# Patient Record
Sex: Male | Born: 1965 | Race: Black or African American | Hispanic: No | Marital: Single | Smoking: Never smoker
Health system: Southern US, Community
[De-identification: ages and names within clinical notes are randomized; demographics above are authoritative.]

## PROBLEM LIST (undated history)

## (undated) DIAGNOSIS — M109 Gout, unspecified: Secondary | ICD-10-CM

## (undated) DIAGNOSIS — F329 Major depressive disorder, single episode, unspecified: Secondary | ICD-10-CM

## (undated) DIAGNOSIS — I10 Essential (primary) hypertension: Secondary | ICD-10-CM

## (undated) DIAGNOSIS — F32A Depression, unspecified: Secondary | ICD-10-CM

## (undated) DIAGNOSIS — I509 Heart failure, unspecified: Secondary | ICD-10-CM

## (undated) HISTORY — PX: APPENDECTOMY: SHX54

---

## 2007-08-30 ENCOUNTER — Emergency Department (HOSPITAL_COMMUNITY): Admission: EM | Admit: 2007-08-30 | Discharge: 2007-08-30 | Payer: Self-pay | Admitting: Emergency Medicine

## 2008-01-05 ENCOUNTER — Ambulatory Visit: Payer: Self-pay | Admitting: Internal Medicine

## 2008-01-05 ENCOUNTER — Inpatient Hospital Stay (HOSPITAL_COMMUNITY): Admission: EM | Admit: 2008-01-05 | Discharge: 2008-01-06 | Payer: Self-pay | Admitting: Emergency Medicine

## 2008-01-05 ENCOUNTER — Ambulatory Visit: Payer: Self-pay | Admitting: Cardiology

## 2008-01-06 ENCOUNTER — Encounter (INDEPENDENT_AMBULATORY_CARE_PROVIDER_SITE_OTHER): Payer: Self-pay | Admitting: Internal Medicine

## 2008-04-26 ENCOUNTER — Emergency Department (HOSPITAL_COMMUNITY): Admission: EM | Admit: 2008-04-26 | Discharge: 2008-04-27 | Payer: Self-pay | Admitting: Emergency Medicine

## 2008-05-07 ENCOUNTER — Emergency Department (HOSPITAL_COMMUNITY): Admission: EM | Admit: 2008-05-07 | Discharge: 2008-05-08 | Payer: Self-pay | Admitting: Family Medicine

## 2008-07-02 ENCOUNTER — Inpatient Hospital Stay (HOSPITAL_COMMUNITY): Admission: EM | Admit: 2008-07-02 | Discharge: 2008-07-04 | Payer: Self-pay | Admitting: Emergency Medicine

## 2008-07-02 ENCOUNTER — Ambulatory Visit: Payer: Self-pay | Admitting: Cardiology

## 2008-07-07 ENCOUNTER — Ambulatory Visit: Payer: Self-pay | Admitting: Internal Medicine

## 2008-07-07 LAB — CONVERTED CEMR LAB
CO2: 32 meq/L (ref 19–32)
Calcium: 9 mg/dL (ref 8.4–10.5)
Creatinine, Ser: 1.8 mg/dL — ABNORMAL HIGH (ref 0.4–1.5)
GFR calc Af Amer: 53 mL/min
GFR calc non Af Amer: 44 mL/min
Glucose, Bld: 96 mg/dL (ref 70–99)
Sodium: 144 meq/L (ref 135–145)

## 2008-08-16 ENCOUNTER — Ambulatory Visit: Payer: Self-pay | Admitting: Internal Medicine

## 2008-08-19 ENCOUNTER — Inpatient Hospital Stay (HOSPITAL_COMMUNITY): Admission: EM | Admit: 2008-08-19 | Discharge: 2008-08-20 | Payer: Self-pay | Admitting: Emergency Medicine

## 2008-08-19 ENCOUNTER — Ambulatory Visit: Payer: Self-pay | Admitting: Cardiology

## 2008-10-22 ENCOUNTER — Ambulatory Visit: Payer: Self-pay | Admitting: Internal Medicine

## 2008-10-27 DIAGNOSIS — I110 Hypertensive heart disease with heart failure: Secondary | ICD-10-CM | POA: Insufficient documentation

## 2008-10-27 DIAGNOSIS — I509 Heart failure, unspecified: Secondary | ICD-10-CM | POA: Insufficient documentation

## 2008-10-27 DIAGNOSIS — E785 Hyperlipidemia, unspecified: Secondary | ICD-10-CM

## 2008-11-01 ENCOUNTER — Emergency Department (HOSPITAL_COMMUNITY): Admission: EM | Admit: 2008-11-01 | Discharge: 2008-11-01 | Payer: Self-pay | Admitting: Emergency Medicine

## 2008-11-01 ENCOUNTER — Ambulatory Visit: Payer: Self-pay | Admitting: Cardiology

## 2008-11-17 ENCOUNTER — Emergency Department (HOSPITAL_COMMUNITY): Admission: EM | Admit: 2008-11-17 | Discharge: 2008-11-17 | Payer: Self-pay | Admitting: Emergency Medicine

## 2008-11-18 ENCOUNTER — Ambulatory Visit: Payer: Self-pay | Admitting: Cardiology

## 2008-11-18 ENCOUNTER — Emergency Department (HOSPITAL_COMMUNITY): Admission: EM | Admit: 2008-11-18 | Discharge: 2008-11-18 | Payer: Self-pay | Admitting: Emergency Medicine

## 2008-11-19 ENCOUNTER — Inpatient Hospital Stay (HOSPITAL_COMMUNITY): Admission: EM | Admit: 2008-11-19 | Discharge: 2008-11-22 | Payer: Self-pay | Admitting: Emergency Medicine

## 2008-11-22 ENCOUNTER — Encounter (INDEPENDENT_AMBULATORY_CARE_PROVIDER_SITE_OTHER): Payer: Self-pay | Admitting: Internal Medicine

## 2008-12-16 ENCOUNTER — Emergency Department (HOSPITAL_COMMUNITY): Admission: EM | Admit: 2008-12-16 | Discharge: 2008-12-16 | Payer: Self-pay | Admitting: Emergency Medicine

## 2008-12-28 ENCOUNTER — Ambulatory Visit: Payer: Self-pay | Admitting: Family Medicine

## 2009-01-20 ENCOUNTER — Encounter: Payer: Self-pay | Admitting: Internal Medicine

## 2009-01-20 ENCOUNTER — Ambulatory Visit: Payer: Self-pay | Admitting: Internal Medicine

## 2009-02-23 ENCOUNTER — Emergency Department (HOSPITAL_COMMUNITY): Admission: EM | Admit: 2009-02-23 | Discharge: 2009-02-24 | Payer: Self-pay | Admitting: Emergency Medicine

## 2009-04-03 ENCOUNTER — Emergency Department (HOSPITAL_COMMUNITY): Admission: EM | Admit: 2009-04-03 | Discharge: 2009-04-03 | Payer: Self-pay | Admitting: Emergency Medicine

## 2009-06-29 ENCOUNTER — Encounter (INDEPENDENT_AMBULATORY_CARE_PROVIDER_SITE_OTHER): Payer: Self-pay | Admitting: *Deleted

## 2010-05-03 IMAGING — CT CT HEAD W/O CM
1 series · 16 of 30 positions shown, 20 images · non-contrast
Comparison: No priors

CLINICAL DATA: Hypertension/dizziness

CT HEAD WITHOUT CONTRAST
TECHNIQUE: Contiguous axial images were obtained from the base of
the skull through the vertex without contrast.

[Series 2: head routine 4.8 h37s · axial · 0.49mm/px · z∈[-118,+40]mm · 16 of 36 slices shown, 20 images]
[im 2/36  brain]
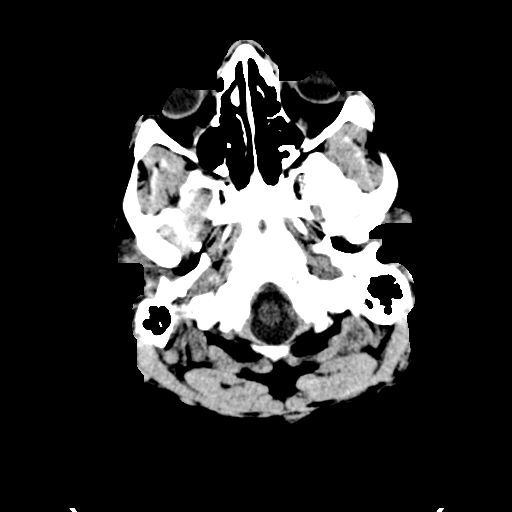
[im 2/36  bone]
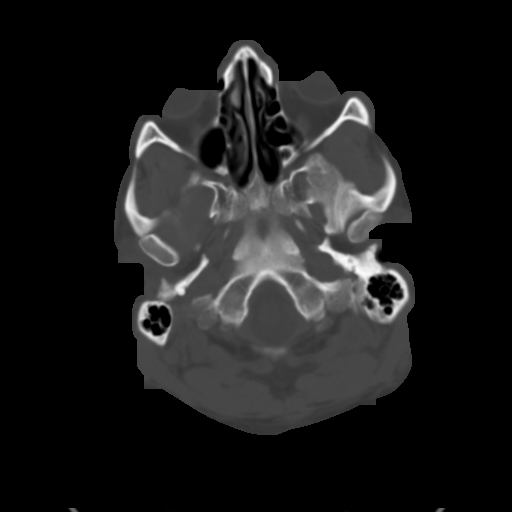
[im 4/36  brain]
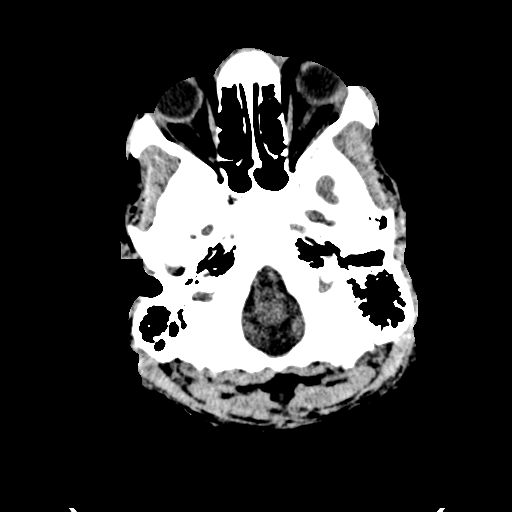
[im 7/36  brain]
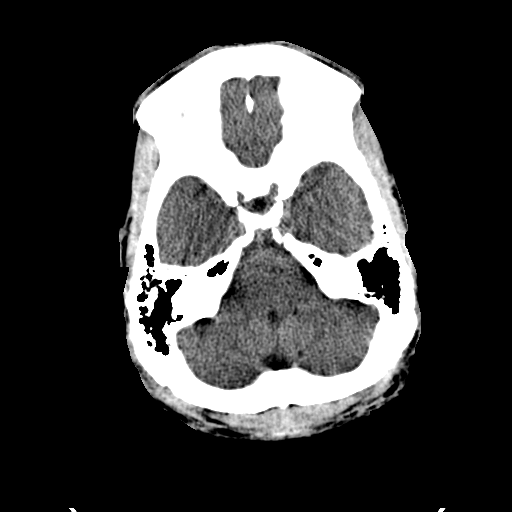
[im 9/36  brain]
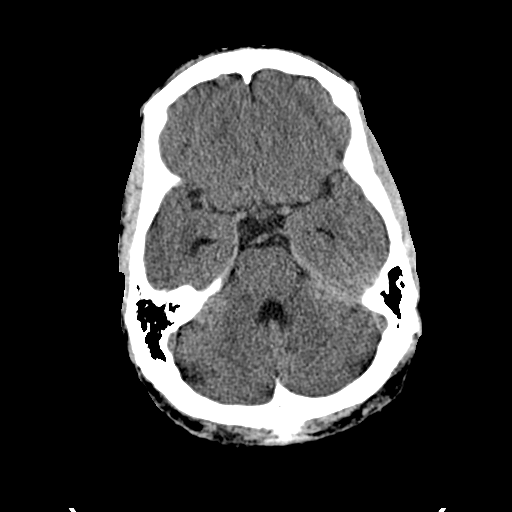
[im 10/36  brain]
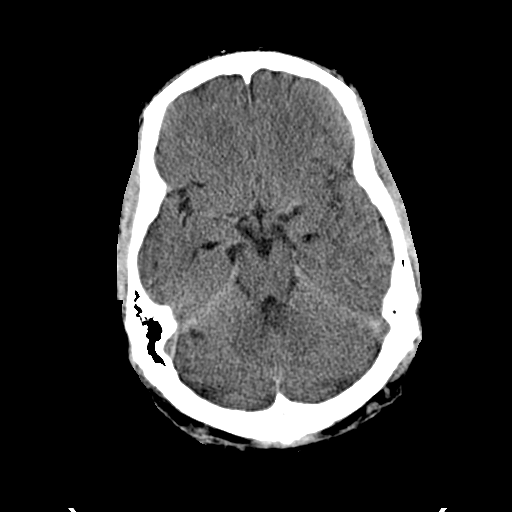
[im 10/36  bone]
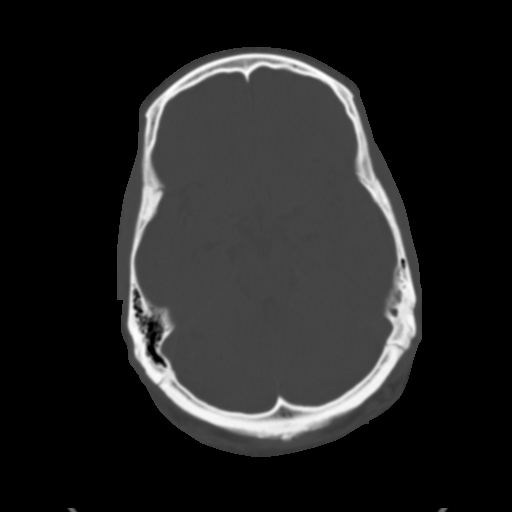
[im 13/36  brain]
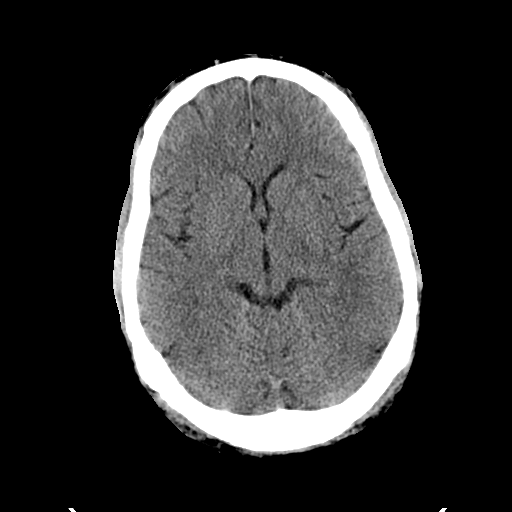
[im 15/36  brain]
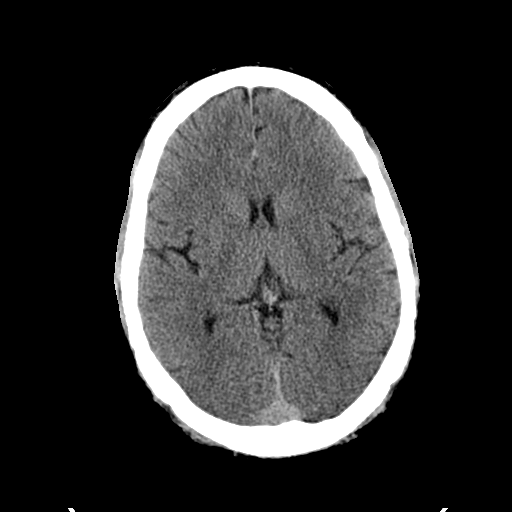
[im 17/36  brain]
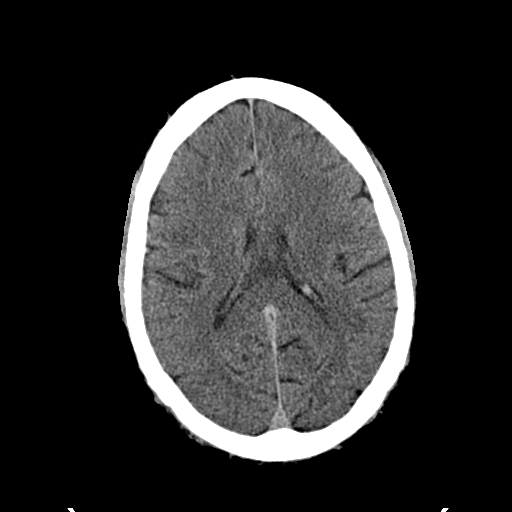
[im 19/36  brain]
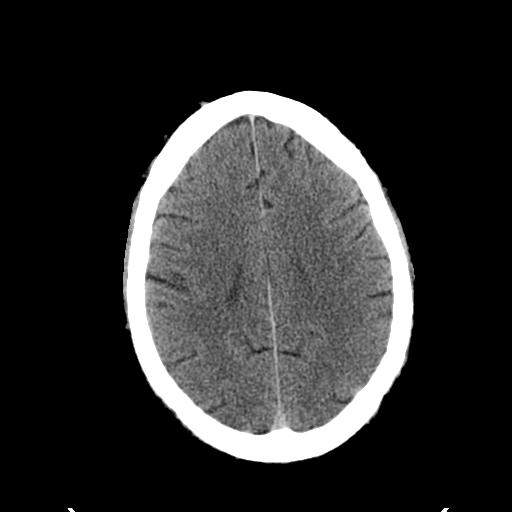
[im 19/36  bone]
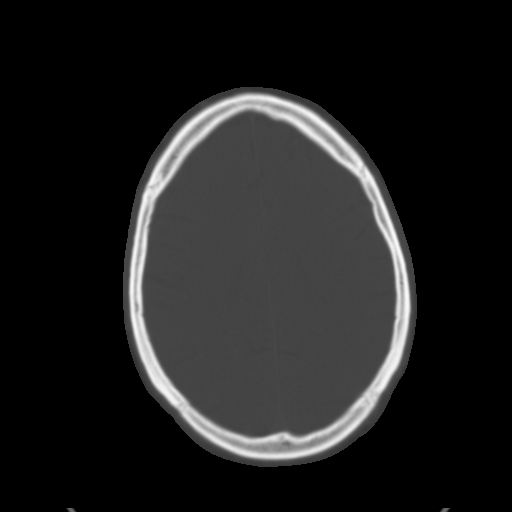
[im 21/36  brain]
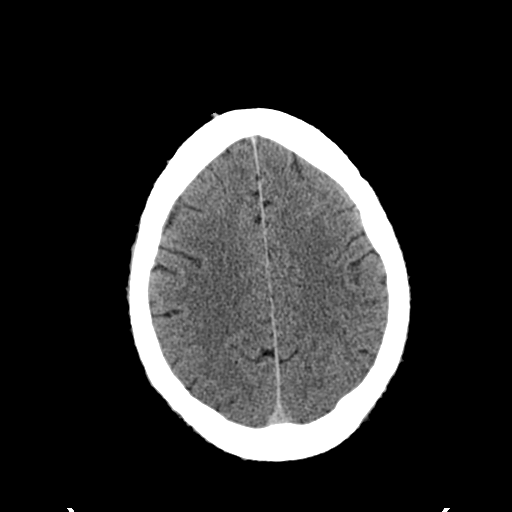
[im 23/36  brain]
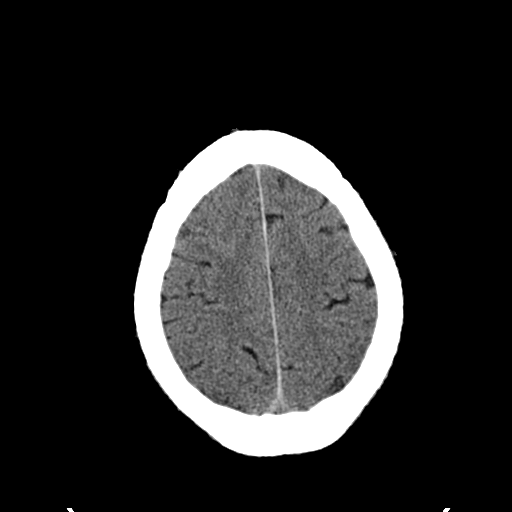
[im 26/36  brain]
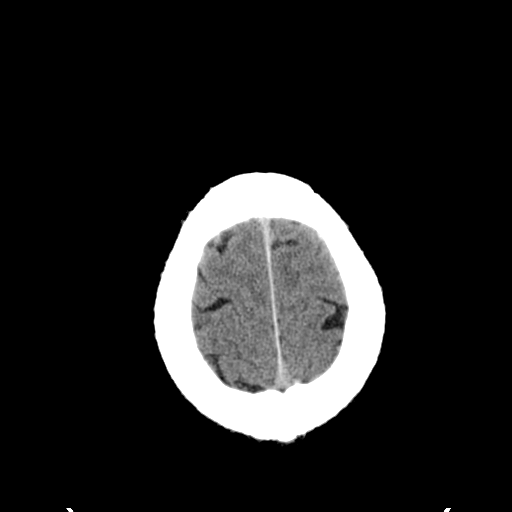
[im 27/36  brain]
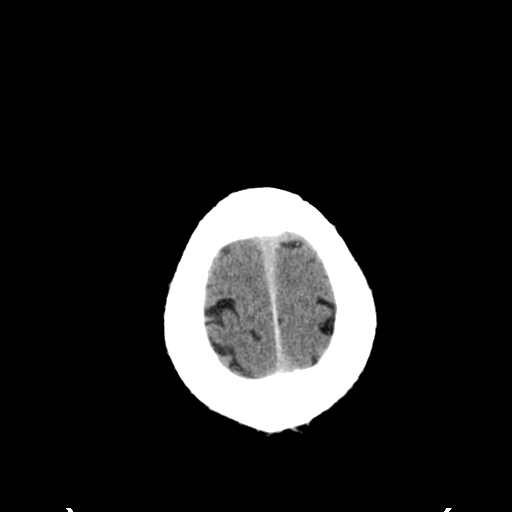
[im 27/36  bone]
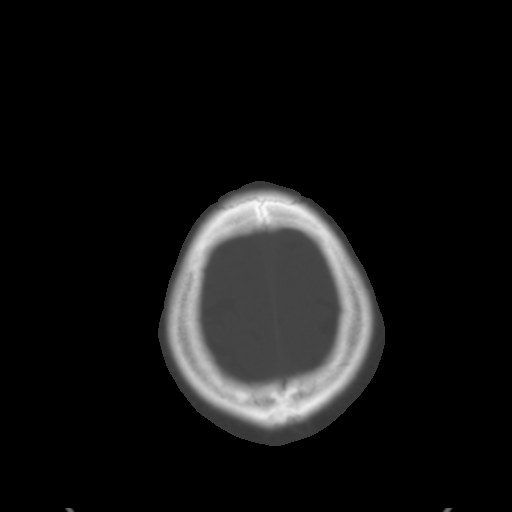
[im 29/36  brain]
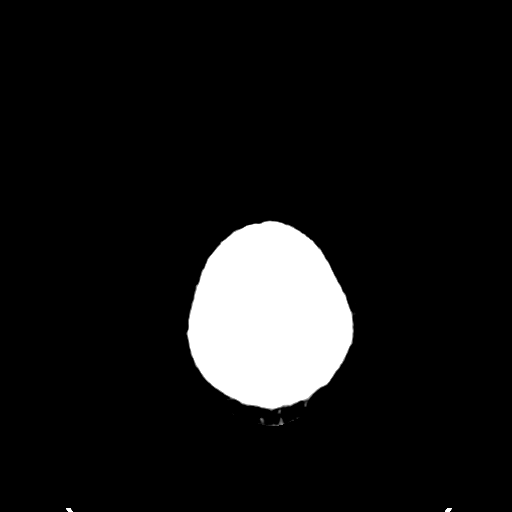
[im 32/36  brain]
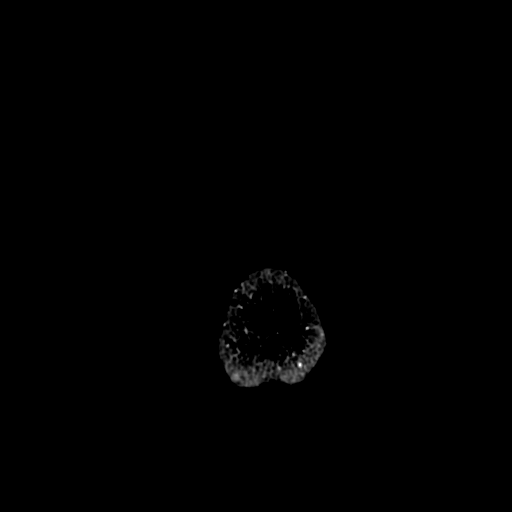
[im 34/36  brain]
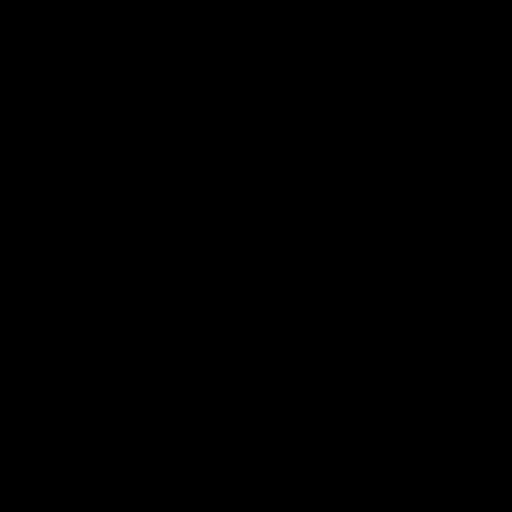

[16 of 30 positions shown; findings below may reference images not displayed]

FINDINGS: Ventricular size and CSF spaces within normal limits. No
evidence for acute infarct or bleed.  No mass effect. Calvarium
intact.  No fluid in the sinuses visualized.
IMPRESSION: No acute or significant findings.

## 2010-05-03 IMAGING — CR DG CHEST 2V
2 series · 2 of 2 positions shown · non-contrast
Comparison: 08/19/2008

CLINICAL DATA: Dizziness and shortness of breath.

CHEST - 2 VIEW

[w chest pa]
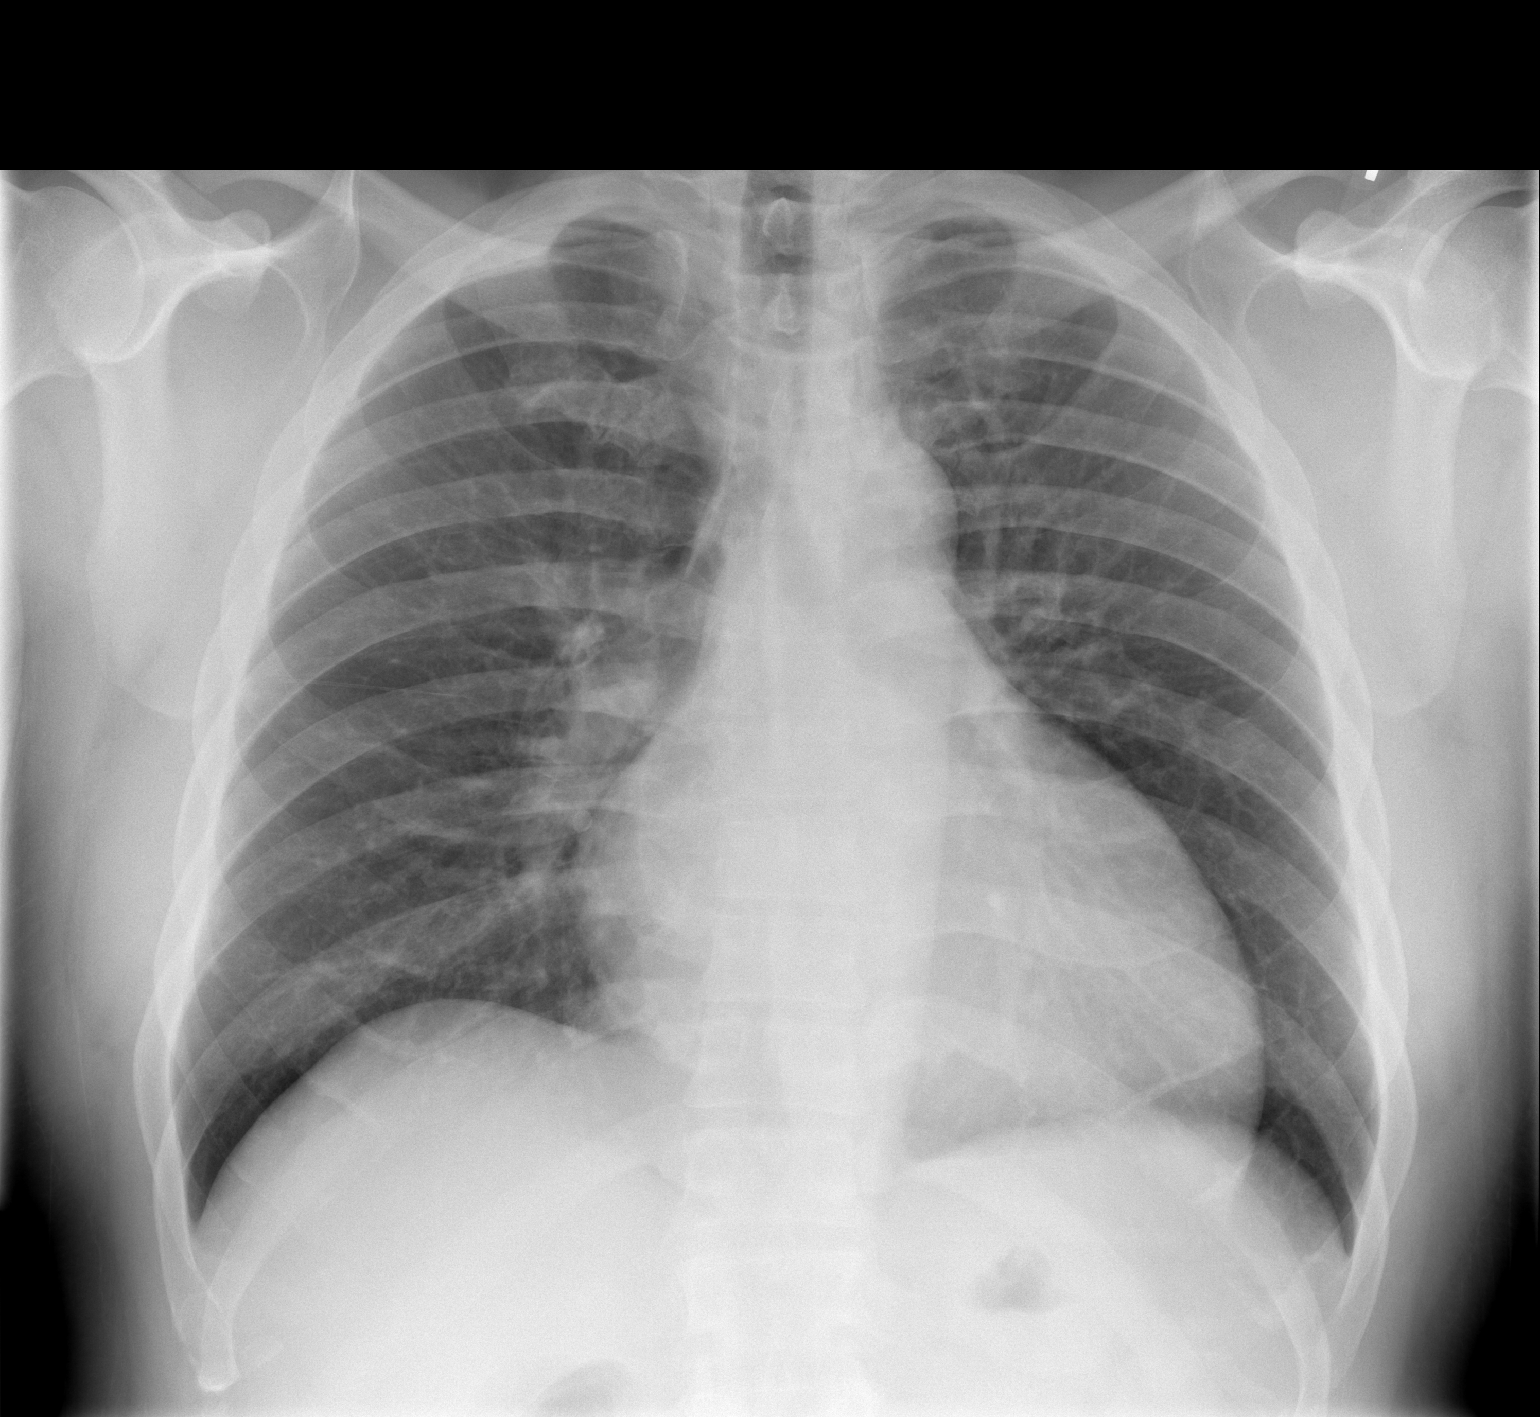

[w chest lat]
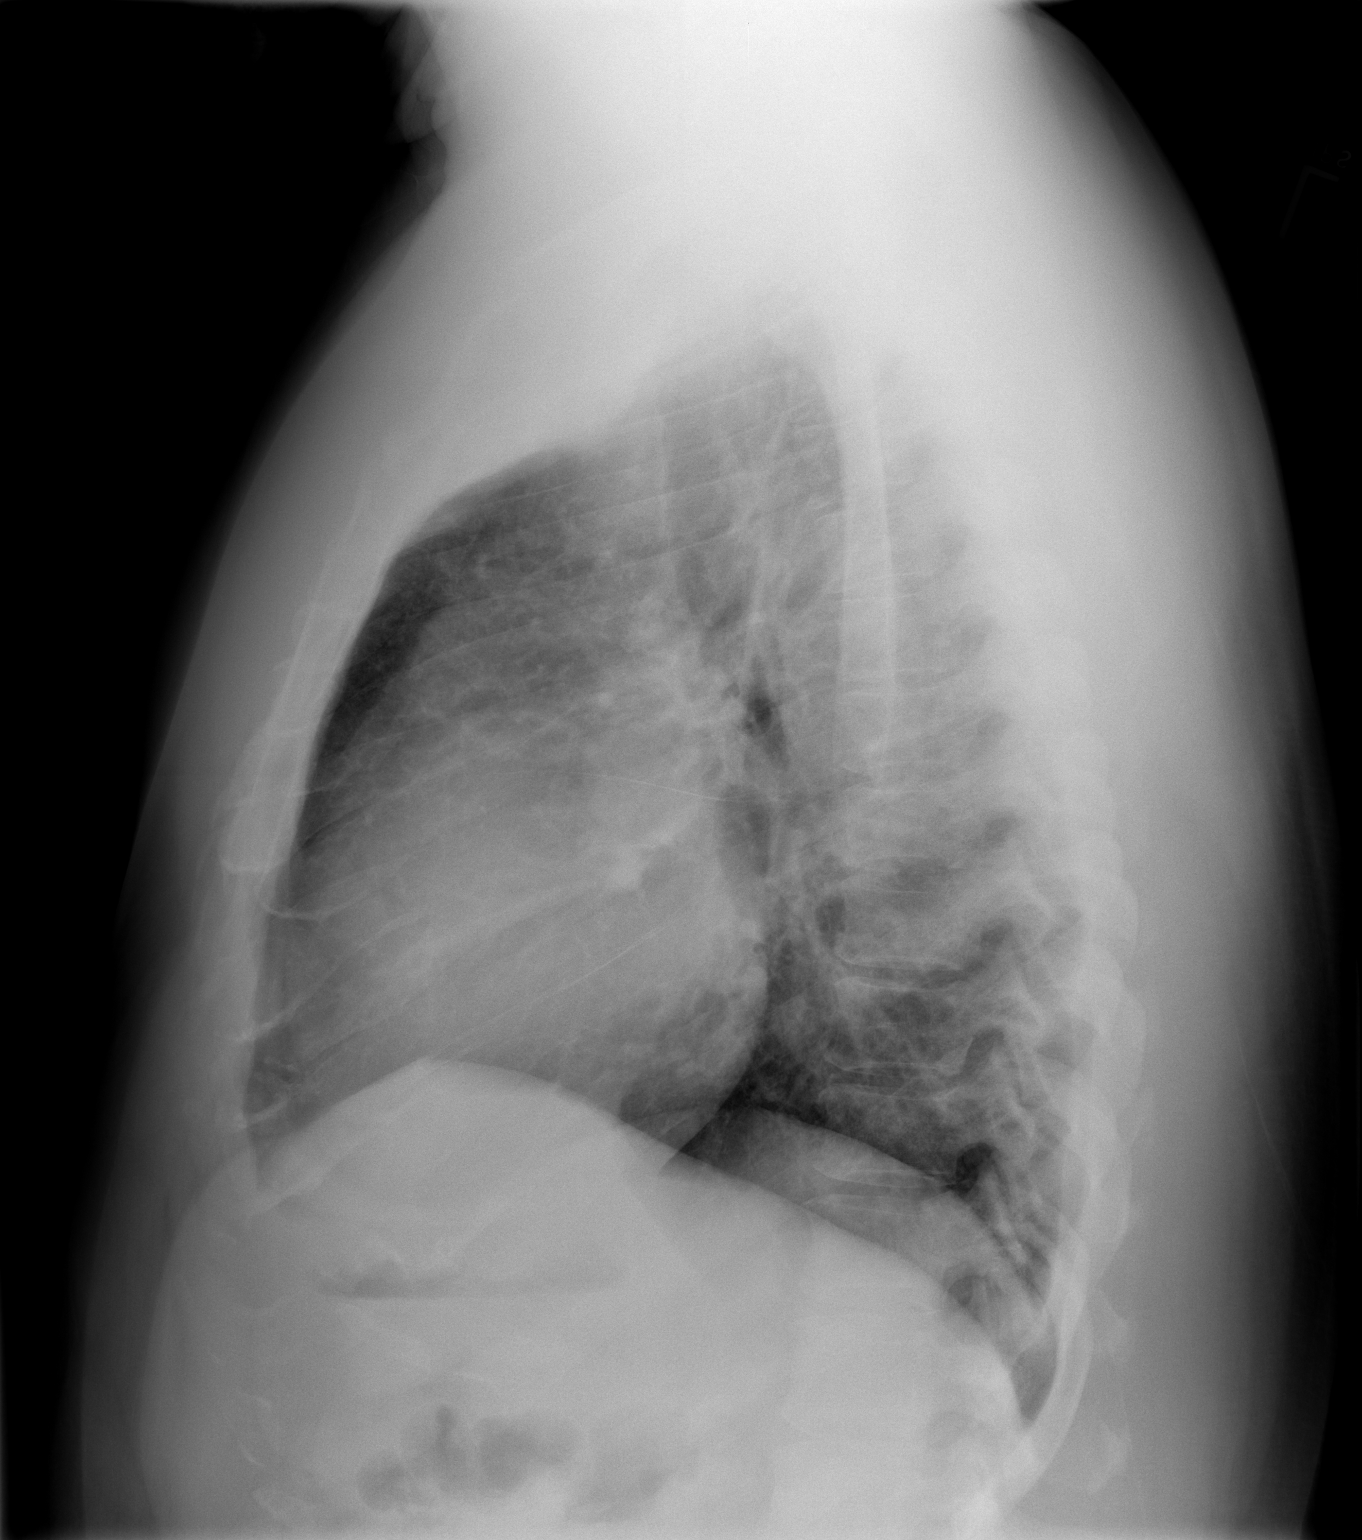

[2 of 2 positions shown; findings below may reference images not displayed]

FINDINGS: The heart is mildly enlarged but stable.  Mild central
vascular congestion but no edema, infiltrates or effusions.  The
bony thorax is intact.
IMPRESSION: 1.  Mild stable cardiac enlargement.
2.  Mild central vascular congestion but no infiltrates, edema or
effusions.

## 2011-01-23 LAB — CBC
HCT: 45.2 % (ref 39.0–52.0)
Hemoglobin: 15.3 g/dL (ref 13.0–17.0)
MCV: 92.7 fL (ref 78.0–100.0)
Platelets: 172 10*3/uL (ref 150–400)
RBC: 4.88 MIL/uL (ref 4.22–5.81)
RDW: 17.1 % — ABNORMAL HIGH (ref 11.5–15.5)
WBC: 8.8 10*3/uL (ref 4.0–10.5)

## 2011-01-23 LAB — BASIC METABOLIC PANEL
BUN: 18 mg/dL (ref 6–23)
Calcium: 8.7 mg/dL (ref 8.4–10.5)
GFR calc non Af Amer: 49 mL/min — ABNORMAL LOW (ref >60)

## 2011-01-23 LAB — DIFFERENTIAL
Lymphs Abs: 2.1 10*3/uL (ref 0.7–4.0)
Monocytes Relative: 5 % (ref 3–12)
Neutro Abs: 5.7 10*3/uL (ref 1.7–7.7)

## 2011-01-23 LAB — POCT CARDIAC MARKERS: CKMB, poc: 2.3 ng/mL (ref 1.0–8.0)

## 2011-01-29 LAB — DIFFERENTIAL
Lymphocytes Relative: 26 % (ref 12–46)
Lymphs Abs: 2.1 10*3/uL (ref 0.7–4.0)
Monocytes Absolute: 0.6 10*3/uL (ref 0.1–1.0)
Monocytes Relative: 7 % (ref 3–12)
Neutro Abs: 5.1 10*3/uL (ref 1.7–7.7)
Neutrophils Relative %: 64 % (ref 43–77)

## 2011-01-29 LAB — URINE MICROSCOPIC-ADD ON

## 2011-01-29 LAB — URINALYSIS, ROUTINE W REFLEX MICROSCOPIC
Bilirubin Urine: NEGATIVE
Hgb urine dipstick: NEGATIVE
Ketones, ur: NEGATIVE mg/dL
Specific Gravity, Urine: 1.023 (ref 1.005–1.030)
pH: 5.5 (ref 5.0–8.0)

## 2011-01-29 LAB — POCT I-STAT, CHEM 8
BUN: 27 mg/dL — ABNORMAL HIGH (ref 6–23)
Chloride: 104 mEq/L (ref 96–112)
Creatinine, Ser: 2 mg/dL — ABNORMAL HIGH (ref 0.4–1.5)
Glucose, Bld: 84 mg/dL (ref 70–99)
HCT: 50 % (ref 39.0–52.0)
Potassium: 3.6 mEq/L (ref 3.5–5.1)

## 2011-01-29 LAB — CBC
Hemoglobin: 15.6 g/dL (ref 13.0–17.0)
RBC: 5.23 MIL/uL (ref 4.22–5.81)
WBC: 8 10*3/uL (ref 4.0–10.5)

## 2011-01-29 LAB — POCT CARDIAC MARKERS
CKMB, poc: 2.4 ng/mL (ref 1.0–8.0)
Troponin i, poc: <0.05 ng/mL (ref 0.00–0.09)

## 2011-01-30 LAB — URINALYSIS, ROUTINE W REFLEX MICROSCOPIC
Leukocytes, UA: NEGATIVE
Protein, ur: 30 mg/dL — AB
Urobilinogen, UA: 0.2 mg/dL (ref 0.0–1.0)

## 2011-01-30 LAB — BASIC METABOLIC PANEL
BUN: 23 mg/dL (ref 6–23)
BUN: 23 mg/dL (ref 6–23)
CO2: 29 mEq/L (ref 19–32)
CO2: 29 mEq/L (ref 19–32)
CO2: 29 mEq/L (ref 19–32)
Calcium: 8.5 mg/dL (ref 8.4–10.5)
Calcium: 8.9 mg/dL (ref 8.4–10.5)
Chloride: 101 mEq/L (ref 96–112)
Chloride: 103 mEq/L (ref 96–112)
Chloride: 105 mEq/L (ref 96–112)
GFR calc Af Amer: >60 mL/min (ref >60)
GFR calc Af Amer: >60 mL/min (ref >60)
GFR calc non Af Amer: 47 mL/min — ABNORMAL LOW (ref >60)
GFR calc non Af Amer: 50 mL/min — ABNORMAL LOW (ref >60)
GFR calc non Af Amer: 57 mL/min — ABNORMAL LOW (ref >60)
Glucose, Bld: 108 mg/dL — ABNORMAL HIGH (ref 70–99)
Glucose, Bld: 115 mg/dL — ABNORMAL HIGH (ref 70–99)
Glucose, Bld: 96 mg/dL (ref 70–99)
Glucose, Bld: 96 mg/dL (ref 70–99)
Potassium: 3.1 mEq/L — ABNORMAL LOW (ref 3.5–5.1)
Potassium: 3.3 mEq/L — ABNORMAL LOW (ref 3.5–5.1)
Potassium: 3.4 mEq/L — ABNORMAL LOW (ref 3.5–5.1)
Potassium: 3.5 mEq/L (ref 3.5–5.1)
Potassium: 3.7 mEq/L (ref 3.5–5.1)
Sodium: 136 mEq/L (ref 135–145)
Sodium: 136 mEq/L (ref 135–145)
Sodium: 137 mEq/L (ref 135–145)
Sodium: 139 mEq/L (ref 135–145)
Sodium: 139 mEq/L (ref 135–145)

## 2011-01-30 LAB — CBC
HCT: 40.4 % (ref 39.0–52.0)
HCT: 41.2 % (ref 39.0–52.0)
HCT: 43.5 % (ref 39.0–52.0)
Hemoglobin: 13.5 g/dL (ref 13.0–17.0)
Hemoglobin: 13.5 g/dL (ref 13.0–17.0)
Hemoglobin: 14.4 g/dL (ref 13.0–17.0)
MCHC: 33.4 g/dL (ref 30.0–36.0)
Platelets: 164 10*3/uL (ref 150–400)
Platelets: 175 10*3/uL (ref 150–400)
RDW: 15.9 % — ABNORMAL HIGH (ref 11.5–15.5)
WBC: 10.7 10*3/uL — ABNORMAL HIGH (ref 4.0–10.5)
WBC: 9.7 10*3/uL (ref 4.0–10.5)

## 2011-01-30 LAB — DIFFERENTIAL
Eosinophils Relative: 1 % (ref 0–5)
Lymphocytes Relative: 19 % (ref 12–46)
Lymphs Abs: 2 10*3/uL (ref 0.7–4.0)
Monocytes Absolute: 0.7 10*3/uL (ref 0.1–1.0)

## 2011-01-30 LAB — GLUCOSE, SYNOVIAL FLUID: Glucose, Synovial Fluid: 90 mg/dL

## 2011-01-30 LAB — CULTURE, BLOOD (ROUTINE X 2)

## 2011-01-30 LAB — BODY FLUID CULTURE

## 2011-01-30 LAB — CARDIAC PANEL(CRET KIN+CKTOT+MB+TROPI)
CK, MB: 1.8 ng/mL (ref 0.3–4.0)
CK, MB: 2.1 ng/mL (ref 0.3–4.0)
Relative Index: 0.7 (ref 0.0–2.5)
Total CK: 257 U/L — ABNORMAL HIGH (ref 7–232)
Total CK: 301 U/L — ABNORMAL HIGH (ref 7–232)

## 2011-01-30 LAB — URINE MICROSCOPIC-ADD ON: Urine-Other: NONE SEEN

## 2011-01-30 LAB — LIPID PANEL
Triglycerides: 111 mg/dL (ref <150)
VLDL: 22 mg/dL (ref 0–40)

## 2011-01-30 LAB — RAPID URINE DRUG SCREEN, HOSP PERFORMED
Amphetamines: NOT DETECTED
Benzodiazepines: NOT DETECTED
Tetrahydrocannabinol: POSITIVE — AB

## 2011-01-30 LAB — BODY FLUID CELL COUNT WITH DIFFERENTIAL: Neutrophil Count, Fluid: 99 % — ABNORMAL HIGH (ref 0–25)

## 2011-01-30 LAB — SYNOVIAL FLUID, CRYSTAL: Crystals, Fluid: NONE SEEN

## 2011-01-30 LAB — TROPONIN I: Troponin I: 0.03 ng/mL (ref 0.00–0.06)

## 2011-01-30 LAB — GRAM STAIN

## 2011-02-27 NOTE — H&P (Signed)
[name], [name]               ACCOUNT NO.:  [id number]   MEDICAL RECORD NO.:  [id number]          PATIENT TYPE:  INP   LOCATION:  0101                         FACILITY:  [facility]   PHYSICIAN:  [name] [name], M.D.      DATE OF BIRTH:  [birth year]   DATE OF ADMISSION:  11/19/2008  DATE OF DISCHARGE:                              HISTORY & PHYSICAL   PRIMARY CARE PHYSICIAN:  [facility].   PRESENTING COMPLAINT:  Left knee pain.   HISTORY OF PRESENT ILLNESS:  The patient is a 45 year old gentleman that  was seen today with complaint of knee pain.  He apparently has had his  knee aspirated twice already and has been doing alright.  However, the  patient has longstanding hypertension.  He has no current primary care  physician, but scheduled to go to [facility].  While being discharged  from the ER, his [name] pressure was noted to be persistently elevated.  He was given a number of medications, including his home therapy, but  apparently no response.  The patient has apparently been off all  antihypertensives for 3 days, but has resumed taking his [name] pressure  medications in the past 24-48 hours.  His [name] pressure systolic has  remained consistently above 200, hence the worry.  He denied any  headaches, no dizziness.  No nausea, vomiting or diarrhea.  No chest  pain.  No cough, no shortness of breath.   PAST MEDICAL HISTORY:  1. Hypertension.  2. CHF.  3. Bronchitis.  4. Nonischemic cardiomyopathy.   ALLERGIES:  THE PATIENT REPORTS ALLERGY TO NORVASC.   MEDICATIONS:  1. Lisinopril 20 mg daily.  2. Lasix 40 mg daily.  3. Clonidine 0.1 mg twice a day.  4. Potassium chloride 10 mEq daily.  5. Imdur 60 mg daily.  6. Coreg 25 mg twice a day.  7. Vicodin as needed.  8. Prednisone that was just given to him 2 days ago.   SOCIAL HISTORY:  The patient lives in [place].  He denied any IV drug  use.  He smokes cigars.   FAMILY HISTORY:  Significant for coronary artery  disease.   REVIEW OF SYSTEMS:  A 12-point review of systems was negative except per  HPI.   PHYSICAL EXAMINATION:  VITAL SIGNS:  His temperature is 97.3.  Initial  [name] pressure 233/139, with a pulse of 82, respiratory rate 20.  His  sats are 97% on room air.  GENERAL:  The patient is awake, alert and oriented in no acute distress.  HEENT:  PERRL.  EOMI.  NECK:  Supple.  No JVD, no lymphadenopathy.  RESPIRATORY:  Good air entry bilaterally.  No wheezes, no rales.  CARDIOVASCULAR:  He has S1-S2, no murmurs.  ABDOMEN:  Soft, nontender with positive bowel sounds.  EXTREMITIES:  Show no edema, cyanosis or clubbing.   LABORATORY DATA:  His sodium is 139, potassium 3.4, chloride 103, CO2 of  27, glucose 108, BUN 30, creatinine 1.62, calcium 8.7.  White count is  10.7, hemoglobin 13.5, platelet count 167 with a left shift.  ANC 7.8.  Urinalysis negative.  Urine drug screen is currently pending.   ASSESSMENT:  1. This is a 45 year old gentleman with severe hypertension.  Most      likely medication noncompliance is playing a role; however, the      patient insists he has been taking his medications consistently in      the past few weeks.  At this point, will admit the patient for      observation.  I will put him on telemetry or step-down bed.  The      patient's pulse rate is in the 60s, which makes it difficult to use      labetalol.  I will try hydralazine while starting on his oral      medications and titrate effectively.  Depending on how he responds      to treatment, we will hopefully get him to go back to [facility]      for further treatment.  2. Hypokalemia.  Will replete his potassium appropriately.  3. Leukocytosis, most likely from his knee swelling.  4. Osteoarthritis.  The patient has a follow-up scheduled with      orthopedics.  He is status post arthrocenteses as mentioned.  We      will continue with conservative measures here in the hospital until      he is seen  by orthopedics as an outpatient.  5. History of congestive heart failure.  Again, it is not clear what      his ejection fraction is, but the patient seemed to be stable for      the most part at this point.  No evidence of decompensation.      Further treatment and management again will depend on the patient's      initial response to treatment here.      [name] [name], M.D.  Electronically Signed     LG/MEDQ  D:  11/19/2008  T:  11/19/2008  Job:  [id number]

## 2011-02-27 NOTE — Consult Note (Signed)
NAMEMarland Kitchen  IVEN, EARNHART NO.:  [id number]   MEDICAL RECORD NO.:  [id number]          PATIENT TYPE:  EMS   LOCATION:  MAJO                         FACILITY:  [facility]   PHYSICIAN:  Rollene Rotunda, MD, FACCDATE OF BIRTH:  03-Oct-1966   DATE OF CONSULTATION:  11/01/2008  DATE OF DISCHARGE:  11/01/2008                                 CONSULTATION   REASON FOR CONSULTATION:  Evaluate the patient with shortness of breath.   HISTORY OF PRESENT ILLNESS:  The patient is a pleasant 45 year old  gentleman seen by Dr. Tenny Craw.  He has a history of hypertensive  cardiomyopathy.  He saw Dr. Tenny Craw several days ago.  His blood pressure  was slightly elevated, but he was otherwise doing fine.  He was having  no new dyspnea.  We are called because he has had some progressive  shortness of breath with exercise per the ER doctor.  However, when I  discussed this with him, he says he has been mildly short of breath, but  it has not been anything dramatic.  He said this has been slowly  progressive over weeks.  He is still able to do his walking and actually  walks to church which is what he was doing yesterday.  He says he does  not walk as briskly.  However, he is not having any PND or orthopnea.  He has had 10-pound weight gain since Thanksgiving, he ascribes to  eating.  He has not had any swelling or abdominal distention.  He mostly  complains of difficulty with his gait.  He said yesterday he found  himself drifting to the left when he would try to walk.  He was not  describing any visual changes.  He was not describing any dizziness.  He  was not describing problems with weakness.  He was having difficulty  speaking.  This morning, he was not having any of these complaints.  However, he talked to his sister who insisted he come to the emergency  room.  He said when he walked to get his x-ray, he was a little bit weak  in his legs but was no longer having the problem with his gait.  In  the  emergency room, he is hypertensive.  His peak blood pressure so far  207/128.  His heart rate 75.  His BNP is 118 with his normal being about  96.  Chest x-ray shows no acute edema.  An EKG demonstrates LVH with  repolarization changes which is unchanged from an EKG dated August 19, 2008.  He does have QT prolongation which also is unchanged.   PAST MEDICAL HISTORY:  1. Cardiomyopathy (EF 35%, secondary to hypertension).  2. Longstanding hypertension for about 5 years.  3. Hyperlipidemia x5 years.  4. Substance abuse (he currently uses marijuana but denies alcohol or      cocaine).   PAST SURGICAL HISTORY:  Appendectomy.   ALLERGIES:  He has been intolerant apparently to AMLODIPINE.   MEDICATIONS:  1. Aspirin 81 mg daily.  2. Carvedilol 25 mg b.i.d.  3. Clonidine 0.1 mg at bedtime.  4. Lasix 40 mg daily.  5. Lisinopril 20 mg daily.  6. Isosorbide 60 mg daily.  7. K-Dur 10 mEq daily.   SOCIAL HISTORY:  The patient has a 47-year-old daughter.  He is currently  not working.  He is single.  He has substance abuse history as  described.   FAMILY HISTORY:  Contributory for his mother having a bypass in her 45s.   REVIEW OF SYSTEMS:  As stated in the HPI.  Negative for blurry vision  which he has had frequently in the past, dyspnea on exertion as  described, dry cough, and frequent urination.  Note, this review of  systems is exactly unchanged from his admission in November.  Otherwise,  negative for all other systems.   PHYSICAL EXAMINATION:  GENERAL:  The patient is pleasant and in no  distress.  VITAL SIGNS:  Blood pressure 210/110 and heart rate 64 and regular.  HEENT:  Eyes are unremarkable.  Pupils equal, round, and reactive to  light.  Fundi within normal limits.  Oral mucosa unremarkable.  NECK:  No jugular venous distention at 45 degrees.  Carotid upstroke  brisk and symmetrical.  No bruits.  No thyromegaly.  LYMPHATICS:  No cervical, axillary, or inguinal  adenopathy.  LUNGS:  Clear to auscultation bilaterally.  BACK:  No costovertebral angle tenderness.  CHEST:  Unremarkable.  HEART:  PMI not displaced or sustained.  S1 and S2 within normal limits.  No S3.  Positive S4.  No murmurs.  ABDOMEN:  Flat.  Positive bowel sounds.  Normal in frequency and pitch.  No bruits, no rebound, no guarding, no midline pulsatile mass, no  hepatomegaly, no splenomegaly.  SKIN:  No rashes.  No nodules.  EXTREMITIES:  Pulses 2+ throughout.  No edema, no cyanosis, no clubbing.  NEURO:  Oriented to person, place, and time.  Cranial nerves II through  XII are grossly intact.  His motor is equal and strong throughout.  Sensory is grossly intact.   EKG; sinus rhythm, rate 60, left axis deviation, left ventricular  hypertrophy by voltage criteria, T-wave inversions in lateral leads  consistent with repolarization changes and unchanged from previous.  The  QT is slightly prolonged.   LABORATORY DATA:  WBC 8.0, hemoglobin 15.6, and platelets 199.  BNP 118.  Chest x-ray; mild cardiomegaly, mild vascular congestion, but no edema.   ASSESSMENT AND PLAN:  1. Heart failure.  We are consulted because of dyspnea.  However, on      careful history taking, this gentleman does not have any increased      shortness of breath compared to baseline.  He is certainly not in      any acute distress.  No objective evidence of volume overload.      Therefore, no cardiac admission for heart failure is warranted.  He      does need control of his blood pressure as described below.  He      needs continued medical compliance.  2. Hypertension.  He has apparently been compliant with his      medications.  He took his clonidine already this morning.  However,      he is quite hypertensive.  I have ordered nitroglycerin paste 2      inches in the ER as well as 0.1 of clonidine.  He should continue      the other meds as listed and can have his meds titrated.  He could      have a  target dose of carvedilol of 50 mg twice a day given his      weight.  Other meds per Dr. Tenny Craw.  He needs continued compliance.      He needs TLC (therapeutic lifestyle changes).  This could include      weight loss and salt restriction.  3. Abnormal gait.  I have suggested to the ER doctor that he be      admitted or neuro be consulted for this as this was quite      concerning.  There are no focal findings on neuro.  I will defer to      Dr. Fonnie Jarvis.  4. Disposition.  Again, I have suggested to the ER doctor that this      patient be admitted.  We would be happy to continue to see him in      consultation.  I think his neuro complaints need to be evaluated.      In addition, his blood pressure is elevated in ER.  Finally, we      have not been successful in getting him a primary care physician.      We      tried to get him into [facility], but we had no response.  I      wonder if he would qualify for [facility] or      Internal Medicine.  Certainly, a primary care physician might be      more successful in keeping him out of the hospital.      Rollene Rotunda, MD, [facility]  Electronically Signed     JH/MEDQ  D:  11/01/2008  T:  11/02/2008  Job:  [id number]
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
## 2011-02-27 NOTE — Consult Note (Signed)
[name] [name], [name] [name]               ACCOUNT NO.:  [id number]   MEDICAL RECORD NO.:  [id number]          PATIENT TYPE:  INP   LOCATION:  2019                         FACILITY:  [facility]   PHYSICIAN:  Everardo Beals. Juanda Chance, MD, FACCDATE OF BIRTH:  29-Feb-1966   DATE OF CONSULTATION:  07/02/2008  DATE OF DISCHARGE:                                 CONSULTATION   PRIMARY CARDIOLOGIST:  New to [facility].   PRIMARY CARE Lucylle Foulkes:  He does not have one.   REQUESTING PHYSICIAN:  Isidor Holts, MD, [facility].   PATIENT PROFILE:  A 45 year old African American male with history of  systolic heart failure, who presents with acute-on-chronic systolic  heart failure and hypertensive urgency.   PROBLEMS:  1. Acute-on-chronic systolic heart failure.      a.     On January 06, 2008, 2-D echocardiogram, ejection fraction 25-       35%, severe diffuse left ventricular hypokinesis, marked left       ventricular hypertrophy, and increased filling pressures, mildly       dilated left atrium, mild-to-moderate right ventricle dysfunction.      b.     Question ischemic versus nonischemic cardiomyopathy.  2. Hypertension/hypertensive urgency.  3. Hyperlipidemia.  4. Polysubstance abuse including tobacco, alcohol, marijuana, and      remote crack.  5. Status post appendectomy.  6. Noncompliant.  7. Lactose intolerance.  8. Recent upper respiratory infection.   HISTORY OF PRESENT ILLNESS:  A 45 year old African American male with  history of systolic heart failure, question ischemic versus nonischemic.  He was in his usual state of health until Monday, June 28, 2008,  when noted upper respiratory and sinus congestion as well as cough.  Symptoms progressed and by Wednesday, June 30, 2008, he noted mild  orthopnea, dyspnea on exertion, and pleuritic right-sided chest pain  with coughing and deep breathing.  He denies any history of edema,  weight gain, increasing abdominal girth, syncope, or  early satiety.  His  dyspnea progressed prompting ED visit on July 02, 2008, where he  was noted to be markedly hypertensive with vascular congestion on chest  x-ray.  He was admitted to the [facility], and has been initiated  on IV Lasix with some improvement in breathing.   ALLERGIES:  NORVASC causes elevated blood pressure.   CURRENT MEDICATIONS:  1. Aspirin 325 mg daily.  2. Coreg 12.5 mg b.i.d.  3. Clonidine 0.2 mg t.i.d.  4. Enoxaparin 40 mg daily.  5. Lasix 40 mg IV b.i.d.  6. Lisinopril 40 mg daily.  7. Nitroglycerin 1 inch q.6 h.  8. Protonix 40 mg daily.  9. K-Dur 40 mEq daily.   FAMILY HISTORY:  Mother is alive at age 22.  She is status post CAD,  CABG, and MI in her late 50s.  Does not know anything about his  biological father.  His sister and brother who are alive and well.   SOCIAL HISTORY:  Lives in [place] with some friends.  He is  unemployed.  He previously smoked a pack of cigars a week for about 5  years, but quit this in July 2009.  He says he drinks less than 6 pack  of beer a week.  He remotely used crack cocaine about 3 years ago, but  denies any currently.  He smokes 4 marijuana cigarettes a day.   REVIEW OF SYSTEMS:  Positive for sinus congestion, pleuritic chest pain,  shortness of breath, and dyspnea on exertion, mild orthopnea, and  productive cough.  Otherwise, all systems reviewed negative.   PHYSICAL EXAMINATION:  VITAL SIGNS:  Temperature 97.4, heart rate 61,  respirations 20, blood pressure 172/99, and pulse ox 94% on room air.  Weight 107.5 kg.  GENERAL:  Pleasant African American male, in no acute distress, awake,  alert, and oriented x3.  HEENT:  Normal.  NEUROLOGIC:  Grossly intact and nonfocal.  SKIN:  Warm and dry without lesions or masses.  NECK:  Mildly elevated JVP.  No bruits.  LUNGS:  Respirations are regular, unlabored.  Clear to auscultation.  CARDIAC:  Regular S1 and S2.  No S3 and S4 or murmurs.  ABDOMEN:   Soft, nontender, and nondistended.  Bowel sounds present x4.  EXTREMITIES:  Warm and dry.  No clubbing, cyanosis, or edema.  Dorsalis  pedis and posterior tibial pulses 2+ bilaterally.   Chest x-ray shows cardiomegaly with central venous congestion.  EKG  shows sinus rhythm at a rate of 90 and left axis deviation, left atrial  enlargement, LVH with T-wave inversion, I, aVL, which was present on  previous EKGs.  There is poor R-wave progression also.  Hemoglobin 14.4,  hematocrit 43.5, WBC 6.2, and platelets 161.  Sodium 140, potassium 3.5,  chloride 101, CO2 of 31, BUN 15, creatinine 1.5, and glucose 143.  Total  bilirubin 1.1, alkaline phosphatase 78, AST 32, ALT 21, total protein  7.1, and albumin 3.8.  Cardiac markers; CK was 1099 with an MB of 4.8,  and troponin I 0.04.  Urine drug screen was positive for marijuana.  INR  is 1.1.   ASSESSMENT AND PLAN:  1. Acute-on-chronic systolic congestive heart failure, ejection      fraction 25-35% by 2-D echocardiogram in March 2009.  He has global      hypokinesis and therefore, I suspect nonischemic cardiomyopathy,      although he has never had an ischemic evaluation.  His symptoms      have improved some with diuresis though he is still mildly      orthopneic and hypertensive.  I agree with beta-blocker and ACE      inhibitor.  As his heart rate trends in the 50s to 60s, no heart      rate room to push his Coreg.  We will add hydralazine 25 t.i.d. as      well as nitrate.  Plan ischemic evaluation, likely Myoview in light      of renal insufficiency once blood pressure better controlled.  The      patient will require significant CHF education as well as follow up      in our Heart Failure Clinic.  I will likely change him to oral      Lasix tomorrow.  2. Hypertension.  See above likely contributing to reduced ejection      fraction. I will add hydralazine and nitrates.  3. Substance abuse.  He says he quit cigars and rarely drinks  EtOH,      but smokes 4 marijuana cigarettes a day, cessation advised.      Nicolasa Ducking, ANP  Bruce Elvera Lennox Juanda Chance, MD, [facility]  Electronically Signed    CB/MEDQ  D:  07/02/2008  T:  07/03/2008  Job:  [id number]

## 2011-02-27 NOTE — Discharge Summary (Signed)
NAMEDELROY, [name] [name]               ACCOUNT NO.:  [id number]   MEDICAL RECORD NO.:  [id number]          PATIENT TYPE:  INP   LOCATION:  1319                         [facility]ACILITY:  [facility]   PHYSICIAN:  Hillery Aldo, M.D.   DATE OF BIRTH:  1965/12/20   DATE OF ADMISSION:  11/18/2008  DATE OF DISCHARGE:  11/22/2008                               DISCHARGE SUMMARY   PRIMARY CARE PHYSICIAN:  None.  The patient will be referred to  [facility] for hospital followup and primary care.   CARDIOLOGIST:  Dr. Tenny Craw.   ORTHOPEDIC SURGEON:  Dr. Lequita Halt.   DISCHARGE DIAGNOSES:  1. Hypertensive urgency.  2. Right knee effusion secondary to synovitis and degenerative joint      disease.  3. Hypokalemia.  4. Stage 2 chronic kidney disease.  5. Dyslipidemia.  6. 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   DISCHARGE MEDICATIONS:  1. Coreg 25 mg p.o. every 12 hours.  2. Clonidine 0.2 mg t.i.d.  3. Lasix 40 mg daily.  4. Potassium chloride 20 mEq every other day.  5. Imdur 60 mg daily.  6. Lisinopril 40 mg daily.  7. Oxycodone 5 to 10 mg every 6 hours p.r.n. pain.   CONSULTATIONS:  None.   BRIEF ADMISSION HPI:  Patient is a 45 year old male who presented to the  hospital with chief complaint of left knee pain.  Patient was evaluated  in the emergency department and was referred to the hospitalist service  for admission secondary to persistently elevated blood pressures with  systolic pressures consistently above 200.  Patient was subsequently  admitted for further evaluation and workup.   PROCEDURES AND DIAGNOSTIC STUDIES:  1. Chest x-ray on November 19, 2008, showed stable marked cardiomegaly.      Pulmonary venous hypertension without overt edema.  No acute      cardiopulmonary disease currently.  2. MRI of the right knee on November 20, 2008, showed a large joint      effusion.  Features of synovitis were noted.  Tricompartmental      degenerative changes,  most notably in the patellofemoral      compartment.  There is edema along the dependent portions of the      vastus medialis and vastus lateralis musculature proximally,      involving medial head of gastrocnemius.  This is nonspecific.      Differential considerations include trauma, infectious myositis, or      ischemic myositis.   DISCHARGE LABORATORY DATA:  Sodium was 139, potassium 3.7, chloride 105,  bicarb 29, BUN 24, creatinine 1.38, glucose 115.   HOSPITAL COURSE BY PROBLEM:  1. Severe hypertension and hypertensive urgency:  Patient was admitted      and initially put on a nitroglycerin drip to get his blood pressure      under better control.  His antihypertensives were adjusted to      achieve better blood pressure control.  Patient's blood pressure is      still suboptimally controlled but much better.  His discharge blood      pressure is 160/89.  The patient's Lasix was held initially due to      some worsening renal insufficiency.  This will be restarted on      discharge and hopefully will improve his blood pressure further.      He will follow up with [facility].  We will get him an appointment      prior to discharge.  2. Right knee effusion secondary to synovitis and degenerative joint      disease:  Patient actually had a synovial fluid tap done on      November 17, 2008.  Cultures from that procedure were negative      though he did have a fairly elevated white blood cell count.  The      patient does have followup scheduled with Dr. Lequita Halt on Wednesday.      He is encouraged to keep this appointment for definitive management      and further evaluation of his chronic knee problems.  3. Hypokalemia:  Patient was appropriately repleted.  His discharge      potassium is 3.7.  4. Stage 2 chronic kidney disease:  Patient does have chronic renal      insufficiency likely due to his underlying severe hypertension.  He      will need better blood pressure control to  prevent progression of      his kidney disease.  Patient is on an ACE inhibitor which should      provide some protection.  His creatinine is stable at discharge.  5. Dyslipidemia:  Patient does have mild dyslipidemia.  Because of his      current situation, would avoid putting him on a statin until he can      follow up with Dr. Tenny Craw and his treating physicians at [facility].      Patient will be fully instructed regarding dietary changes to help      get his LDL under better control.  6. 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  The      results are pending at the time of this dictation.  The patient is      clinically well compensated.   DISPOSITION:  Patient is medically stable and will be discharged home.  He does have followup scheduled with Dr. Lequita Halt and Dr. Tenny Craw, his  cardiologist.  We will attempt to get him an appointment at [facility]  prior to discharge.   STUDIES TO BE FOLLOWED UP ON:  The patient's two-dimensional  echocardiogram will need to be followed up on by either Dr. Tenny Craw or his  primary care physician at [facility].   TIME SPENT COORDINATING CARE FOR DISCHARGE AND DISCHARGE INSTRUCTIONS:  Equals 35 minutes.      Hillery Aldo, M.D.  Electronically Signed     CR/MEDQ  D:  11/22/2008  T:  11/22/2008  Job:  [id number]   cc:   Melvern Banker  Fax: [phone]   Pricilla Riffle, MD, [facility]  [address]  [address]  [place]  [place] [address]   Ollen Gross, M.D.  Fax: [phone]

## 2011-02-27 NOTE — Discharge Summary (Signed)
[name] [name], [name] [name]               ACCOUNT NO.:  [id number]   MEDICAL RECORD NO.:  [id number]          PATIENT TYPE:  INP   LOCATION:  2019                         FACILITY:  [facility]   PHYSICIAN:  Isidor Holts, M.D.  DATE OF BIRTH:  January 15, 1966   DATE OF ADMISSION:  07/01/2008  DATE OF DISCHARGE:  07/04/2008                               DISCHARGE SUMMARY   PRIORITY DISCHARGE SUMMARY   PRIMARY MEDICAL DOCTOR:  Unassigned.   DISCHARGE DIAGNOSES:  1. Systolic congestive heart failure, ejection fraction 25% to 35%.  2. Severe uncontrolled hypertension.  3. Upper respiratory tract infection.  4. Dyslipidemia.  5. History of marijuana use.  6. History of lactose intolerance.   DISCHARGE MEDICATIONS:  1. Aspirin 81 mg p.o. daily.  2. Coreg 25 mg p.o. b.i.d.  3. Hydralazine 50 mg p.o. t.i.d.  4. Lisinopril 40 mg p.o. daily.  5. Imdur 90 mg p.o. daily.  6. K-Dur 20 mEq p.o. daily.  7. Pravastatin 20 mg p.o. q.h.s.  8. Clonidine 0.1 mg p.o. q.h.s.  9. Lasix 40 mg p.o. b.i.d.  10.Azithromycin 250 mg p.o. daily for 3 days only, from July 05, 2008.   PROCEDURES:  1. Chest x-ray dated July 01, 2008:  This showed cardiomegaly      with central venous congestion.  2. Stress Myoview dated July 04, 2008:  This showed no evidence      of ischemia, moderate left ventricular dysfunction with dilated      ventricular cavity, calculated ejection fraction of 34% with      evidence of global hypokinesis.   CONSULTATIONS:  Dr. Charlies Constable, [facility].   ADMISSION HISTORY:  As in H&P notes of July 01, 2008, dictated by  [name]  However in brief, this is a 45 year old male,  with known history of systolic heart failure, EF 25% to 35% by a two-D  echocardiogram, January 06, 2008, hypertension, previous smoking history,  history of marijuana use, history of lactose intolerance, dyslipidemia,  presenting with increasing shortness of breath over 2 to  3 days,  preceded by an upper respiratory tract infection.  He was admitted for  further evaluation, investigation, and management.   CLINICAL COURSE:  1. Systolic congestive heart failure:  For history of the      presentation, refer to admission history above.  Patient's chest x-      ray demonstrated pulmonary vascular congestion.  His BNP was      elevated at 512.  He was managed with intravenous Lasix as well as      Coreg, and ACE inhibitor treatment.  Cardiac enzymes were cycled      and remained unelevated.  A 12-lead EKG showed no acute ischemic      changes.  Cardiology consultation was kindly provided by Dr. Charlies Constable, from [facility].  For details of this consultation,      refer to consultation notes of July 02, 2008.  Patient      responded to above-mentioned management measures, and as a matter  of fact, by July 03, 2008, his BNP had dropped to 96 and he      appeared euvolemic.   1. Severe uncontrolled hypertension:  Patient at the time of      presentation, had a markedly elevated blood pressure at 234/135.      This in part, may have been contributory to the decompensation of      CHF.  He was managed with multiple antihypertensive medications,      including Coreg, Hydralazine, Lisinopril, Imdur, Clonidine, and      Lasix, and as of July 04, 2008,  BP has markedly improved at      147/92.  Further improvement is anticipated.   1. Dyslipidemia:  Patient had the following lipid profile:  Total      cholesterol 197, triglycerides 69, HDL 25, LDL 158.  He has been      started on statin treatment accordingly.   1. Upper respiratory tract infection:  Patient's congestive heart      failure decompensation was preceded by an upper respiratory tract      illness, associated with a mild cough.  There was no evidence of      pneumonic consolidation on chest x-ray.  Patient was initially      managed symptomatically, however, on July 03, 2008, he started      producing yellowish phlegm, necessitating commencing him on a 5-day      course of antibiotics, to be completed on July 07, 2008.   1. History of marijuana use:  Patient's urine drug screen was positive      for tetrahydrocannabinol.  Patient has been counseled      appropriately.   1. Smoking history:  Patient emphatically states that he quit smoking      since his last hospitalization in March 2009.  He has been      encouraged to continue to abstain from smoking.   DISPOSITION:  Patient on July 04, 2008, was considered clinically  stable to be discharged.  He underwent a stress Myoview on the same  date, which was negative for inducible ischemia.  For details of  findings, refer to procedure list, above.  Patient was cleared for  discharge by Dr. Dietrich Pates, Cardiologist, and arrangements have been  put in place for followup in the Cardiology Clinic.   DIET:  Heart healthy.   ACTIVITY:  As tolerated.   FOLLOWUP INSTRUCTIONS:  Patient is recommended to follow up with Dr.  Dietrich Pates, [facility], on a date to be scheduled.  He will be  contacted by Dr. Charlott Rakes office to schedule date and time.  All this has  been communicated to patient and he verbalized understanding.      Isidor Holts, M.D.  Electronically Signed     CO/MEDQ  D:  07/04/2008  T:  07/04/2008  Job:  [id number]   cc:   Pricilla Riffle, MD, [facility]  Bruce R. Juanda Chance, MD, [facility]

## 2011-02-27 NOTE — Assessment & Plan Note (Signed)
[facility]                            CARDIOLOGY OFFICE NOTE   NAME:Fujiwara, Janoah                        MRN:          [id number]  DATE:10/22/2008                            DOB:          [birth year]    IDENTIFICATION:  Mr. Husain is a 45 year old gentleman who I saw last in  November.  He has a history of nonischemic cardiomyopathy and  significant hypertension.   When I saw him last, I tried to get him into the [facility].  He has not received a call back from them.   He says he is taking his medicines.  Has a little bit of cough, but  nonproductive.  Breathing is a little worse with cold weather.  Denies  PND.  Appetite good.  He has cut back on his medicines in half so he can  afford them.  He is not taking all that he had been again because of  cost.   CURRENT MEDICINES:  1. Aspirin 81.  2. Coreg 25 b.i.d.  3. Clonidine 0.1.  4. Lasix 40 daily.  5. Lisinopril 20 daily (instead of 40).  6. Isorbid 60.  7. K-Dur 10 mEq.   PHYSICAL EXAMINATION:  GENERAL:  On exam, the patient is in no distress.  VITAL SIGNS:  Blood pressure 166/112, pulse is 80, weight is 248.  NECK:  JVP is normal.  LUNGS:  No rales or wheezes.  CARDIAC:  Regular rate and rhythm.  S1 and S2.  No definite S3.  ABDOMEN:  Benign.  EXTREMITIES:  No edema.   IMPRESSION:  1. Cardiomyopathy.  Volume status looks good.  I need to try to get      him back on medicines.  Again, we will try to get in touch with      [facility].  2. Hypertension, not controlled.  He is on lisinopril.  I have given      him an ARB to take when he runs out.  We will need to have blood      work, but we will defer for right now.  I will be in touch with him      once I have heard from [facility].     Pricilla Riffle, MD, [facility]  Electronically Signed    PVR/MedQ  DD: 10/26/2008  DT: 10/27/2008  Job #: [id number]

## 2011-02-27 NOTE — H&P (Signed)
NAMEMarland Kitchen  [name], [name] [name] NO.:  [id number]   MEDICAL RECORD NO.:  [id number]          PATIENT TYPE:  EMS   LOCATION:  MAJO                         FACILITY:  [facility]   PHYSICIAN:  Rollene Rotunda, MD, FACCDATE OF BIRTH:  03-08-1966   DATE OF ADMISSION:  11/01/2008  DATE OF DISCHARGE:  11/01/2008                              HISTORY & PHYSICAL   PRIMARY CARE PHYSICIAN:  None.   CARDIOLOGIST:  Pricilla Riffle, MD, [facility]   REASON FOR PRESENTATION:  Evaluate the patient with shortness of breath.   HISTORY OF PRESENT ILLNESS:  The patient    INCOMPLETE DICTATION.      Rollene Rotunda, MD, [facility]  Electronically Signed     JH/MEDQ  D:  11/01/2008  T:  11/02/2008  Job:  [id number]
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
## 2011-02-27 NOTE — H&P (Signed)
[name] [name], [name]:  [id number]   MEDICAL RECORD [name].:  [id number]          PATIENT TYPE:  INP   LOCATION:  2019                         FACILITY:  [facility]   PHYSICIAN:  Eduard Clos, MDDATE OF BIRTH:  June 30, 1966   DATE OF ADMISSION:  07/01/2008  DATE OF DISCHARGE:                              HISTORY & PHYSICAL   PRIMARY CARE PHYSICIAN:  The patient's primary care physician is  unassigned.   CHIEF COMPLAINT:  Shortness of breath.   HISTORY OF THE PRESENT ILLNESS:  The patient is a 45 year old male with  a known history of systolic heart failure with a 2-D echo done January 06, 2008 showing an EF of 25-35% and with severely diffuse left ventricular  hyperkinesia who presented to the ER with increasing shortness of  breath.  The patient has been having episodes of shortness of breath  over the last 2-3 days, which initially started off as a nasal condition  and he took some TheraFlu.  Secondary to this he became more short of  breath.  The patient had orthopnea and paroxysmal nocturnal dyspnea last  night, and he decided to come to the ER.  The patient also has been  noticing some chest tightness during these episodes of shortness of  breath.   In the ER the patient was found to be very hypertensive with x-ray  showing some congestion.  The patient was given IV Lasix and admitted  for further management.  The patient denies any palpitations, dizziness,  loss of consciousness, weakness, nausea, vomiting, diarrhea, fever,  chills, dysuria, discharge, as well as any headache.   PAST MEDICAL HISTORY:  1. Systolic heart failure with an EF of 25-35% with severely diffuse      left ventricular hyperkinesia and left ventricular wall thickness.  2. Hypertension.  3. History of polysubstance abuse.   PAST SURGICAL HISTORY:  Appendectomy.   MEDICATIONS:  The patient's medications prior to admission include:  1. Clonidine  0.2 mg twice daily.  2. Coreg 25  mg by mouth twice daily.  3. Lasix 40 mg daily.  4. The patient was on lisinopril and potassium chloride, which the      patient has stopped taking for a few days.   ALLERGIES:  NORVASC.   FAMILY HISTORY:  The family history is significant for the patient's  mother having CABG at age 59.   SOCIAL HISTORY:  The patient presently denies smoking cigarettes,  drinking alcohol or using illegal drugs; however, per the records the  patient was smoking cigarettes, drinking alcohol and using drugs.   REVIEW OF SYSTEMS:  The review of systems is as per the history of  present illness and there is nothing else of significance.   PHYSICAL EXAMINATION:  GENERAL APPEARANCE:  On physical exam the patient  is examined at the bedside.  He is not in acute distress at this time.  VITAL SIGNS:  Blood pressure is 168/80 and on admission it was 234/135.  Pulse is 73.  Respirations are 18 and O2 sat is 93%.  Temperature is  99.1.  HEENT:  Anicteric.  [name] pallor.  CHEST:  The chest is bilaterally clear to auscultation.  [name] rhonchi.  [name]  wheezing.  [name] crepitations.  HEART:  S1 and S2 are heard.  ABDOMEN:  The abdomen is soft and nontender. Bowel sounds are heard.  [name]  guarding.  [name] rebound. [name] masses.  NEUROLOGIC EXAMINATION:  Central nervous system shows patient is alert  and oriented to time, place and person.  EXTREMITIES:  The extremities show peripheral pulses are felt.  [name]  edema.   LABORATORY DATA:  Chest x-ray showed cardiomegaly with congestion.  EKG  showed normal sinus rhythm with LVH pattern with [name] acute changes .  CBC; WBC 84, hemoglobin 16, hematocrit 47 and platelets 168,000.  PT/INR  15 and 1.1 respectively.  Complete metabolic panel; sodium 138,  potassium 3.1, chloride 101, glucose 107, BUN 18, and creatinine 1.4.  Total bilirubin 1.1, alkaline phosphatase 78, AST 32, ALT 21, total  protein 7.1, and albumin 3.8.  CK/MB 2.1, troponin I 11.05.  BNP 512.   ASSESSMENT:  1. Acute  systolic heart failure.  2. Uncontrolled hypertension.   PLAN:  1. Admit the patient to telemetry.  2. We will cycle cardiac markers.  3. Place the patient on Lasix IV.  4. Strict input and output with daily weights.  5. Nitroglycerin patch.  6. Continue Coreg.  7. Add lisinopril.  8. Supplemental potassium.  9. Further recommendations as per orders.  10.The patient will need a cardiology consult.      Eduard Clos, MD  Electronically Signed     ANK/MEDQ  D:  07/02/2008  T:  07/02/2008  Job:  [id number]

## 2011-02-27 NOTE — Discharge Summary (Signed)
NAMEMarland [name]  EMMANUELL, KANTZ NO.:  [id number]   MEDICAL RECORD NO.:  [id number]          PATIENT TYPE:  INP   LOCATION:  6532                         FACILITY:  [facility]   PHYSICIAN:  Luis Abed, MD, FACCDATE OF BIRTH:  06-22-1966   DATE OF ADMISSION:  08/19/2008  DATE OF DISCHARGE:  08/20/2008                               DISCHARGE SUMMARY   PRIMARY CARDIOLOGIST:  Pricilla Riffle, MD, [facility]   PRIMARY CARE Pariss Hommes:  None.   DISCHARGE DIAGNOSIS:  Acute-on-chronic systolic congestive heart  failure.   SECONDARY DIAGNOSES:  1. Hypertensive urgency.  2. Nonischemic cardiomyopathy.  3. Medication nonadherence.  4. Hyperlipidemia.  5. Polysubstance abuse.  6. Chronic kidney disease.  7. LACTOSE INTOLERANCE.  8. Status post appendectomy.   PROCEDURES:  None.   HISTORY OF PRESENT ILLNESS:  This is a 45 year old male with prior  history of nonischemic cardiomyopathy with an EF of 35% thought to be  secondary to poorly-controlled hypertension who presented to the [facility] ED on August 19, 2008, with complaints of cough, orthopnea, and  PND for 1 week prior to the admission.  The patient had been off of his  medications for approximately 1 month at that time.  In the emergency  room, he was hypertensive with blood pressure of 212/140, and his chest  x-ray showed cardiomegaly with slight vascular congestion.  The patient  was admitted for further evaluation and management of hypertensive  urgency as well as acute-on-chronic systolic congestive heart failure.   HOSPITAL COURSE:  The patient was reinitiated on his home medications  including beta-blocker, ACE inhibitor, and Lasix therapy.  He was also  placed on nitroglycerin infusion for his blood pressure.  He was found  to be hyperkalemic and he was aggressively orally supplemented with  potassium.  He did have some clinical improvement and plan was for  resumption of hydralazine while weaning off his IV  nitroglycerin.  Unfortunately, the patient decided to leave against medical advice on  August 20, 2008.   LABORATORY WORK:  At the time of discharge, hemoglobin 14.8, hematocrit  43.6, WBC 9.6, platelets 196.  Sodium 141, potassium 2.8, chloride 101,  CO2 30, BUN 21, creatinine 0.76.  Glucose 101.  Total bilirubin 2.0,  alkaline phosphatase 75, AST 22, ALT 22, total protein 6.6, albumin 3.5,  calcium 8.6, CK-MB 3.2, troponin I less than 0.5.  Urine drug screen was  positive for THC.  Urinalysis was negative.   DISPOSITION:  The patient left the hospital against medical advice.      Nicolasa Ducking, ANP      Luis Abed, MD, [facility]  Electronically Signed    CB/MEDQ  D:  10/18/2008  T:  10/18/2008  Job:  [id number]

## 2011-03-02 NOTE — Discharge Summary (Signed)
NAMEMarland Kitchen  [name], [name] NO.:  [id number]   MEDICAL RECORD NO.:  [id number]          PATIENT TYPE:  INP   LOCATION:  2623                         FACILITY:  [facility]   PHYSICIAN:  Alvester Morin, M.D.  DATE OF BIRTH:  [birth year]   DATE OF ADMISSION:  01/05/2008  DATE OF DISCHARGE:  01/06/2008                               DISCHARGE SUMMARY   The patient left AMA and he signed the consent form for leaving against  medical advice.   DISCHARGE DIAGNOSES:  1. Hypertension.  2. Congestive heart failure.  3. Lactose intolerance.  4. Hyperlipidemia.  5. Polysubstance abuse including tobacco, alcohol, and marijuana.   DISCHARGE MEDICATIONS:  The patient was encouraged to use his  medications and be more compliant with them.  He is supposed to be  using:  1. Clonidine 0.2 mg b.i.d.  2. Lisinopril 40 mg daily.  3. Potassium 20 mEq daily.  4. Lasix 40 mg p.o. daily.  5. Carvedilol 12.5 mg p.o. b.i.d.   DISPOSITION:  There was no follow-up scheduled since the patient left  [facility], but he was encouraged to seek medical attention in case he develops  any acute symptoms of CHF exacerbation again.  He was also encouraged to  visit his primary care physician in [place], [place], in order to adjust  his blood pressure medications and have a formal follow-up with him  regarding his medical status.   PROCEDURE:  1. The patient had a two-dimensional echocardiogram that showed left      ventricle mildly dilated with overall left ventricular systolic      function markedly decreased.  Left ventricular ejection fraction      was estimated to be between 25% to 30%.  There was severe diffuse      left ventricular hypokinesis, left ventricular wall thickness was      markedly increased.  Doppler parameters were consistent with high      left ventricular filling pressure.  There were also aortic valve      thickness mildly increased.  Left atrium was mildly dilated and      right  ventricular systolic function was mild to moderately reduced.      All findings were consistent with congestive heart failure.  2. The patient also has during this admission a portable chest x-ray      that shows cardiomegaly and pulmonary vascular congestion and he      also has two views chest x-ray that confirmed the findings of      enlarged heart, vascular congestion without overt pulmonary edema      or confluent air space opacity.  There was no pleural effusion      present and no acute osseous findings were demonstrated on this      examination.  3. The patient also had renal ultrasound that was completely normal      with left kidney measuring 11 cm and no evidence of hydronephrosis      or stone and right kidney measuring 11.9 cm without evidence of      hydronephrosis or stone.  CONSULTATIONS:  There were no consultations during this admission.   HISTORY OF PRESENT ILLNESS:  The patient is a 45 year old male with past  medical history of hypertension, CHF, and cannabis abuse, who came to  the emergency department with shortness of breath since Friday and  awakening the morning of admission coughing and gasping for air.  The  patient endorses that he needs to put two pillows under his head at the  moment of sleep or sleep on his side to avoid coughing and shortness of  breath.  The patient reports being compliant with medication and denies  chest pain, diaphoresis, palpitations, hematemesis, abdominal pain,  melena, or hematochezia.  Negative for right upper quadrant discomfort.  Positive for orthopnea and also positive for paroxysmal nocturnal  dyspnea.  Of note, the patient confirmed not good adherence to his  medications to our attending physician, Alvester Morin, M.D.  He said  that he normally uses them, but he half the time wait too much in order  to refill his medications and he also forgets to take his medications on  daily basis a couple of days of the week.    ADMISSION PHYSICAL EXAMINATION:  VITAL SIGNS:  Temperature 97.6, blood  pressure 202/129, heart rate 64, respiratory rate 16, oxygen saturation  98% on room air.  GENERAL:  The patient was laying in bed speaking in full sentences in no  acute distress.  HEENT:  Eyes were PERRLA.  Extraocular muscles intact.  No icterus  present.  ENT; moist mucous membranes without lesions and no erythema.  NECK:  Supple.  No thyromegaly and no bruits.  LUNGS:  Clear to auscultation.  No crackles, no rales.  Just decreased  air movement at the bases.  Other than that completely normal to  auscultation.  CARDIOVASCULAR:  Regular rate and rhythm.  No murmurs, rubs, or gallops.  GASTROINTESTINAL:  Nontender and nondistended.  Positive bowel sounds.  EXTREMITIES:  No cyanosis, clubbing, or edema with good pulses  bilaterally.  SKIN:  No lesions, no rash.  The skin of his legs was a little bit dry.  No lymphadenopathy present.  Full range of motion of four limbs.  NEUROLOGY:  The patient was alert and awake and oriented x3.  Cranial  nerves II-XII grossly intact.  Muscle strength 5/5.  Normal heel-to-shin  and normal finger-to-nose examination.  Intact cerebellar function.  2+  deep tendon reflexes bilaterally and symmetrical.  PSYCHIATRIC:  The patient has appropriate affect.   LABORATORY DATA:  BNP 492.  CBC; white blood cell count 7.7, hemoglobin  15.8, hematocrit 48, and platelet count 198.  ANC 4.4, MCV 93.6.  Sodium  135, potassium 2.8, chloride 100, bicarb 26, glucose 131, BUN 16,  creatinine 1.52.  Magnesium 1.8.  Random urine creatinine 164.6, random  urine sodium 107.  UDS was positive for tetrahydrocannabinol  metabolites.  Lipid profile; total cholesterol 216, triglycerides 109,  HDL 22, LDL 172.   HOSPITAL COURSE:  Problem 1.  Hypertension most likely secondary to not  good adherence to his medications or probably secondary to the need of  increase in the doses of the medication that he is  using or another  agent in order to have better control of his blood pressure.  The  patient was started on nitroprusside drip in order to have good control  of his blood pressure and he also received IV Lasix in order to have  much better control of the blood pressure and at the  same time decrease  the vascular congestion of his lungs since the patient has CHF and was  complaining of old symptoms characteristic of CHF exacerbation.  This  amount of Lasix medication will help in order to control both problems.  The next day after starting on these medications, his blood pressure  improved consistently with decrease in the systolic blood pressure to  160 and diastolic blood pressure of 98.  At that moment the patient was  feeling much better and without really giving any good reason, he  decided to leave the hospital AMA and he signed the consent.  The  patient was explained with further details the importance for him to  receive medical attention, but he just said that he needed to leave.  Also the nurse planned to advise him and Dr. Maple Hudson was also trying to  convince the patient to stay, but he signed the form and he left AMA.  At the moment that he actually was leaving, he was encouraged to follow  with primary care physician in [place], [place], in order to have  adjustment of the medication that he is using and also to have BMET  rechecked since the creatinine level was increased and he is using two  medications that are potentially harmful for the kidneys.   Problem 2.  Hyperlipidemia.  The patient is not using any medication at  this moment for his hyperlipidemia, so he is encouraged to follow with  his primary care physician and he is going to be an excellent candidate  to be started on statins plus Niacin in order to treat his  hyperlipidemia.   Problem 3.  CHF.  The patient had a two-dimensional echocardiogram  consistently with congestive heart failure findings and because of  the  high blood pressure with left ventricular hypertrophy and ejection  fraction of 20-25%, he is right now using lisinopril and Lasix.  We put  the patient on a low sodium diet and we tried to restrict the amount of  fluid that he intakes everyday in order to try to improve as much as  possible his CHF.  It is going to be also important for him to follow  with his primary care physician and if needed add spironolactone to his  regimen.   Problem 4.  Renal failure most likely secondary to uncontrolled  hypertension and also CHF and medications.  He received a little bit of  challenge fluids in order to try to relieve the dehydration for ruling  out prerenal causes for his high creatinine level.  We ordered a random  urine sodium that was 107 and a random urine creatinine that was 164.6.  There was no more time for [place] to rule out and clarify the etiology of  his renal insufficiency or just the hypertension since the patient left  [facility].  Hopefully the patient is going to be followed by his primary care  physician and he is going to receive further evaluation and treatment  that he needs.   Problem 5.  Polysubstance abuse.  The patient received a formal  counseling regarding smoking, alcohol, and marijuana cessation and he  also received information of substance abuse, meetings, and counseling  as an outpatient.   At the moment of discharge, vital signs were temperature 98.3, blood  pressure 155/99, heart rate 69, respiratory rate 18, oxygen saturation  96% on room air.  Cardiac enzymes x1 were negative.  Sodium was 141,  potassium 4.0, chloride 103, bicarb 29,  glucose 115, BUN 18, creatinine  1.7, calcium 8.8, albumin 3.3, total protein 6.2, ALT 19, AST 19,  alkaline phosphatase 64, with a total bilirubin of 1.2.  CBC; white  blood cell count 7.5, hemoglobin 14.8, hematocrit 44, platelets 184.      Rosanna Randy, MD  Electronically Signed      Alvester Morin, M.D.   Electronically Signed    CEM/MEDQ  D:  01/21/2008  T:  01/22/2008  Job:  [id number]

## 2011-07-09 LAB — POCT CARDIAC MARKERS
CKMB, poc: 1.6
Myoglobin, poc: 108
Operator id: 288831
Troponin i, poc: <0.05

## 2011-07-09 LAB — COMPREHENSIVE METABOLIC PANEL
ALT: 19
ALT: 20
AST: 19
AST: 23
Alkaline Phosphatase: 74
CO2: 26
CO2: 29
Calcium: 8.6
Calcium: 8.8
Chloride: 103
GFR calc Af Amer: 53 — ABNORMAL LOW
GFR calc Af Amer: >60
GFR calc non Af Amer: 44 — ABNORMAL LOW
Potassium: 2.8 — ABNORMAL LOW
Sodium: 135
Sodium: 141
Total Protein: 7.1

## 2011-07-09 LAB — LIPID PANEL
HDL: 22 — ABNORMAL LOW
Total CHOL/HDL Ratio: 9.8
VLDL: 22

## 2011-07-09 LAB — DIFFERENTIAL
Eosinophils Absolute: 0.2
Eosinophils Relative: 3
Lymphs Abs: 2.5
Monocytes Relative: 7

## 2011-07-09 LAB — POCT I-STAT, CHEM 8
Creatinine, Ser: 2.2 — ABNORMAL HIGH
HCT: 44
Hemoglobin: 15
Potassium: 3.6
Sodium: 141
TCO2: 33

## 2011-07-09 LAB — RAPID URINE DRUG SCREEN, HOSP PERFORMED
Amphetamines: NOT DETECTED
Barbiturates: NOT DETECTED
Benzodiazepines: NOT DETECTED
Cocaine: NOT DETECTED
Opiates: NOT DETECTED

## 2011-07-09 LAB — CBC
MCHC: 33
MCHC: 33.4
RBC: 4.71
RBC: 5.12
RDW: 14.8
WBC: 7.5

## 2011-07-12 LAB — POCT I-STAT, CHEM 8
Calcium, Ion: 1.04 — ABNORMAL LOW
Glucose, Bld: 78
HCT: 46
Hemoglobin: 15.6
Potassium: 3.7
TCO2: 34

## 2011-07-12 LAB — CBC
Hemoglobin: 13.6
MCHC: 33.9
RDW: 15.1

## 2011-07-12 LAB — DIFFERENTIAL
Basophils Relative: 1
Eosinophils Absolute: 0.3
Eosinophils Relative: 4
Monocytes Relative: 6
Neutrophils Relative %: 64

## 2011-07-12 LAB — POCT CARDIAC MARKERS: CKMB, poc: 2.6

## 2011-07-13 LAB — COMPREHENSIVE METABOLIC PANEL
BUN: 22
CO2: 31
Calcium: 8.4
Chloride: 103
Creatinine, Ser: 1.53 — ABNORMAL HIGH
GFR calc non Af Amer: 50 — ABNORMAL LOW
Glucose, Bld: 97
Total Bilirubin: 1.4 — ABNORMAL HIGH

## 2011-07-13 LAB — POCT CARDIAC MARKERS
Myoglobin, poc: 143
Operator id: 234501
Troponin i, poc: <0.05

## 2011-07-13 LAB — CBC
HCT: 42.1
Hemoglobin: 13.7
MCHC: 32.6
MCV: 93.9
RBC: 4.48

## 2011-07-13 LAB — DIFFERENTIAL
Basophils Absolute: 0
Lymphocytes Relative: 23
Lymphs Abs: 1.8
Neutrophils Relative %: 68

## 2011-07-16 LAB — CBC
HCT: 43.5
Hemoglobin: 14.4
MCHC: 33.1
MCV: 91.8
Platelets: 163
RBC: 4.75
RDW: 15.1
WBC: 8.4

## 2011-07-16 LAB — PROTIME-INR
INR: 1.1
Prothrombin Time: 15

## 2011-07-16 LAB — POCT I-STAT, CHEM 8
BUN: 18
Calcium, Ion: 1 — ABNORMAL LOW
HCT: 47
Hemoglobin: 16
Sodium: 138
TCO2: 29

## 2011-07-16 LAB — RAPID URINE DRUG SCREEN, HOSP PERFORMED
Amphetamines: NOT DETECTED
Barbiturates: NOT DETECTED
Cocaine: NOT DETECTED
Opiates: NOT DETECTED
Tetrahydrocannabinol: POSITIVE — AB

## 2011-07-16 LAB — BASIC METABOLIC PANEL
CO2: 31
Calcium: 8.7
Calcium: 8.8
Calcium: 8.9
GFR calc Af Amer: 55 — ABNORMAL LOW
GFR calc Af Amer: 55 — ABNORMAL LOW
GFR calc Af Amer: >60
GFR calc non Af Amer: 45 — ABNORMAL LOW
GFR calc non Af Amer: 45 — ABNORMAL LOW
GFR calc non Af Amer: 51 — ABNORMAL LOW
Potassium: 3.5
Sodium: 137
Sodium: 140
Sodium: 140

## 2011-07-16 LAB — URINALYSIS, ROUTINE W REFLEX MICROSCOPIC
Bilirubin Urine: NEGATIVE
Glucose, UA: NEGATIVE
Hgb urine dipstick: NEGATIVE
Ketones, ur: NEGATIVE
Protein, ur: NEGATIVE
Urobilinogen, UA: 1

## 2011-07-16 LAB — URINE MICROSCOPIC-ADD ON

## 2011-07-16 LAB — HEPATIC FUNCTION PANEL
Albumin: 3.8
Alkaline Phosphatase: 78
Bilirubin, Direct: 0.1
Indirect Bilirubin: 1 — ABNORMAL HIGH
Total Bilirubin: 1.1

## 2011-07-16 LAB — POCT CARDIAC MARKERS
CKMB, poc: 2.1
Myoglobin, poc: 222

## 2011-07-16 LAB — CK TOTAL AND CKMB (NOT AT ARMC)
CK, MB: 3.2
CK, MB: 4.8 — ABNORMAL HIGH
Relative Index: 0.3
Relative Index: 0.4
Relative Index: 0.4

## 2011-07-16 LAB — DIFFERENTIAL
Basophils Absolute: 0
Eosinophils Absolute: 0.2
Eosinophils Relative: 3
Lymphs Abs: 0.9
Neutrophils Relative %: 83 — ABNORMAL HIGH

## 2011-07-16 LAB — TROPONIN I
Troponin I: 0.04
Troponin I: 0.04

## 2011-07-16 LAB — B-NATRIURETIC PEPTIDE (CONVERTED LAB): Pro B Natriuretic peptide (BNP): 512 — ABNORMAL HIGH

## 2011-07-16 LAB — LIPID PANEL: Cholesterol: 197

## 2011-07-17 LAB — HEPATIC FUNCTION PANEL
ALT: 22
AST: 22
Albumin: 3.5
Alkaline Phosphatase: 75
Total Bilirubin: 2 — ABNORMAL HIGH
Total Protein: 6.6

## 2011-07-17 LAB — URINE MICROSCOPIC-ADD ON

## 2011-07-17 LAB — RAPID URINE DRUG SCREEN, HOSP PERFORMED
Barbiturates: NOT DETECTED
Opiates: NOT DETECTED
Tetrahydrocannabinol: POSITIVE — AB

## 2011-07-17 LAB — GLUCOSE, CAPILLARY: Glucose-Capillary: 107 — ABNORMAL HIGH

## 2011-07-17 LAB — CBC
MCV: 91.3
Platelets: 196
RBC: 4.77
WBC: 9.6

## 2011-07-17 LAB — DIFFERENTIAL
Lymphocytes Relative: 17
Lymphs Abs: 1.6
Monocytes Relative: 7
Neutro Abs: 7.1
Neutrophils Relative %: 73

## 2011-07-17 LAB — BASIC METABOLIC PANEL
CO2: 30
Calcium: 8.6
Chloride: 101
Chloride: 102
Creatinine, Ser: 1.57 — ABNORMAL HIGH
Creatinine, Ser: 1.76 — ABNORMAL HIGH
GFR calc Af Amer: 51 — ABNORMAL LOW
GFR calc Af Amer: 58 — ABNORMAL LOW
GFR calc non Af Amer: 48 — ABNORMAL LOW
Sodium: 141

## 2011-07-17 LAB — URINALYSIS, ROUTINE W REFLEX MICROSCOPIC
Bilirubin Urine: NEGATIVE
Nitrite: NEGATIVE
Specific Gravity, Urine: 1.016
pH: 6

## 2018-07-18 ENCOUNTER — Other Ambulatory Visit: Payer: Self-pay

## 2018-07-18 ENCOUNTER — Emergency Department (HOSPITAL_COMMUNITY): Payer: Self-pay

## 2018-07-18 ENCOUNTER — Inpatient Hospital Stay (HOSPITAL_COMMUNITY)
Admission: EM | Admit: 2018-07-18 | Discharge: 2018-07-20 | DRG: 291 | Disposition: A | Discharge status: Home / Self Care | Payer: Self-pay | Attending: Internal Medicine | Admitting: Internal Medicine

## 2018-07-18 ENCOUNTER — Inpatient Hospital Stay (HOSPITAL_COMMUNITY): Payer: Self-pay

## 2018-07-18 ENCOUNTER — Encounter (HOSPITAL_COMMUNITY): Payer: Self-pay

## 2018-07-18 DIAGNOSIS — Z09 Encounter for follow-up examination after completed treatment for conditions other than malignant neoplasm: Secondary | ICD-10-CM

## 2018-07-18 DIAGNOSIS — I5023 Acute on chronic systolic (congestive) heart failure: Secondary | ICD-10-CM

## 2018-07-18 DIAGNOSIS — I5030 Unspecified diastolic (congestive) heart failure: Secondary | ICD-10-CM

## 2018-07-18 DIAGNOSIS — N179 Acute kidney failure, unspecified: Secondary | ICD-10-CM | POA: Diagnosis present

## 2018-07-18 DIAGNOSIS — N183 Chronic kidney disease, stage 3 (moderate): Secondary | ICD-10-CM | POA: Diagnosis present

## 2018-07-18 DIAGNOSIS — Z9119 Patient's noncompliance with other medical treatment and regimen: Secondary | ICD-10-CM

## 2018-07-18 DIAGNOSIS — E785 Hyperlipidemia, unspecified: Secondary | ICD-10-CM

## 2018-07-18 DIAGNOSIS — I130 Hypertensive heart and chronic kidney disease with heart failure and stage 1 through stage 4 chronic kidney disease, or unspecified chronic kidney disease: Principal | ICD-10-CM | POA: Diagnosis present

## 2018-07-18 DIAGNOSIS — I10 Essential (primary) hypertension: Secondary | ICD-10-CM | POA: Diagnosis present

## 2018-07-18 DIAGNOSIS — M109 Gout, unspecified: Secondary | ICD-10-CM | POA: Diagnosis present

## 2018-07-18 DIAGNOSIS — I440 Atrioventricular block, first degree: Secondary | ICD-10-CM | POA: Diagnosis present

## 2018-07-18 DIAGNOSIS — I509 Heart failure, unspecified: Secondary | ICD-10-CM

## 2018-07-18 DIAGNOSIS — I5021 Acute systolic (congestive) heart failure: Secondary | ICD-10-CM

## 2018-07-18 DIAGNOSIS — Z9111 Patient's noncompliance with dietary regimen: Secondary | ICD-10-CM

## 2018-07-18 DIAGNOSIS — E876 Hypokalemia: Secondary | ICD-10-CM

## 2018-07-18 DIAGNOSIS — Z8249 Family history of ischemic heart disease and other diseases of the circulatory system: Secondary | ICD-10-CM

## 2018-07-18 HISTORY — DX: Heart failure, unspecified: I50.9

## 2018-07-18 HISTORY — DX: Depression, unspecified: F32.A

## 2018-07-18 HISTORY — DX: Major depressive disorder, single episode, unspecified: F32.9

## 2018-07-18 HISTORY — DX: Gout, unspecified: M10.9

## 2018-07-18 HISTORY — DX: Essential (primary) hypertension: I10

## 2018-07-18 LAB — CBC WITH DIFFERENTIAL/PLATELET
BASOS PCT: 1 %
Basophils Absolute: 0 10*3/uL (ref 0.0–0.1)
EOS ABS: 0.9 10*3/uL — AB (ref 0.0–0.7)
EOS PCT: 11 %
HCT: 44.8 % (ref 39.0–52.0)
Hemoglobin: 14.7 g/dL (ref 13.0–17.0)
Lymphocytes Relative: 19 %
Lymphs Abs: 1.5 10*3/uL (ref 0.7–4.0)
MCH: 29.9 pg (ref 26.0–34.0)
MCHC: 32.8 g/dL (ref 30.0–36.0)
MCV: 91.1 fL (ref 78.0–100.0)
MONO ABS: 0.4 10*3/uL (ref 0.1–1.0)
MONOS PCT: 5 %
Neutro Abs: 5 10*3/uL (ref 1.7–7.7)
Neutrophils Relative %: 64 %
Platelets: 230 10*3/uL (ref 150–400)
RBC: 4.92 MIL/uL (ref 4.22–5.81)
RDW: 15.9 % — AB (ref 11.5–15.5)
WBC: 7.8 10*3/uL (ref 4.0–10.5)

## 2018-07-18 LAB — COMPREHENSIVE METABOLIC PANEL
ALBUMIN: 3 g/dL — AB (ref 3.5–5.0)
ALK PHOS: 73 U/L (ref 38–126)
ALT: 17 U/L (ref 0–44)
AST: 20 U/L (ref 15–41)
Anion gap: 12 (ref 5–15)
BUN: 34 mg/dL — ABNORMAL HIGH (ref 6–20)
CALCIUM: 8.7 mg/dL — AB (ref 8.9–10.3)
CO2: 28 mmol/L (ref 22–32)
CREATININE: 1.95 mg/dL — AB (ref 0.61–1.24)
Chloride: 106 mmol/L (ref 98–111)
GFR calc Af Amer: 43 mL/min — ABNORMAL LOW (ref >60)
GFR calc non Af Amer: 37 mL/min — ABNORMAL LOW (ref >60)
GLUCOSE: 173 mg/dL — AB (ref 70–99)
Potassium: 3.1 mmol/L — ABNORMAL LOW (ref 3.5–5.1)
SODIUM: 146 mmol/L — AB (ref 135–145)
Total Bilirubin: 0.8 mg/dL (ref 0.3–1.2)
Total Protein: 6.9 g/dL (ref 6.5–8.1)

## 2018-07-18 LAB — URINALYSIS, ROUTINE W REFLEX MICROSCOPIC
Bilirubin Urine: NEGATIVE
GLUCOSE, UA: NEGATIVE mg/dL
Ketones, ur: NEGATIVE mg/dL
Leukocytes, UA: NEGATIVE
Nitrite: NEGATIVE
PH: 5 (ref 5.0–8.0)
Protein, ur: 100 mg/dL — AB
SPECIFIC GRAVITY, URINE: 1.012 (ref 1.005–1.030)

## 2018-07-18 LAB — I-STAT TROPONIN, ED: Troponin i, poc: 0.06 ng/mL (ref 0.00–0.08)

## 2018-07-18 LAB — TROPONIN I
Troponin I: 0.06 ng/mL (ref <0.03)
Troponin I: 0.07 ng/mL (ref <0.03)

## 2018-07-18 LAB — MAGNESIUM: MAGNESIUM: 1.8 mg/dL (ref 1.7–2.4)

## 2018-07-18 LAB — ECHOCARDIOGRAM COMPLETE
HEIGHTINCHES: 71 in
Weight: 4240 oz

## 2018-07-18 LAB — BRAIN NATRIURETIC PEPTIDE: B Natriuretic Peptide: 325.3 pg/mL — ABNORMAL HIGH (ref 0.0–100.0)

## 2018-07-18 MED ORDER — HYDRALAZINE HCL 20 MG/ML IJ SOLN
10.0000 mg | INTRAMUSCULAR | Status: DC | PRN
  Administered 2018-07-19 (×2): 10 mg via INTRAVENOUS
  Filled 2018-07-18 (×2): qty 1

## 2018-07-18 MED ORDER — HYDRALAZINE HCL 20 MG/ML IJ SOLN
10.0000 mg | Freq: Four times a day (QID) | INTRAMUSCULAR | Status: DC | PRN
  Administered 2018-07-18: 10 mg via INTRAVENOUS
  Filled 2018-07-18: qty 1

## 2018-07-18 MED ORDER — POTASSIUM CHLORIDE CRYS ER 20 MEQ PO TBCR
40.0000 meq | EXTENDED_RELEASE_TABLET | Freq: Once | ORAL | Status: AC
  Administered 2018-07-18: 40 meq via ORAL
  Filled 2018-07-18: qty 2

## 2018-07-18 MED ORDER — ALLOPURINOL 100 MG PO TABS
100.0000 mg | ORAL_TABLET | Freq: Every day | ORAL | Status: DC
  Administered 2018-07-19 – 2018-07-20 (×2): 100 mg via ORAL
  Filled 2018-07-18 (×2): qty 1

## 2018-07-18 MED ORDER — DIPHENHYDRAMINE HCL 25 MG PO CAPS
25.0000 mg | ORAL_CAPSULE | Freq: Once | ORAL | Status: AC
  Administered 2018-07-19: 25 mg via ORAL
  Filled 2018-07-18: qty 1

## 2018-07-18 MED ORDER — FUROSEMIDE 10 MG/ML IJ SOLN
40.0000 mg | Freq: Once | INTRAMUSCULAR | Status: AC
  Administered 2018-07-18: 40 mg via INTRAVENOUS
  Filled 2018-07-18: qty 4

## 2018-07-18 MED ORDER — AMLODIPINE BESYLATE 10 MG PO TABS: 10.0000 mg | ORAL_TABLET | Freq: Every day | ORAL | Status: DC

## 2018-07-18 MED ORDER — ONDANSETRON HCL 4 MG/2ML IJ SOLN: 4.0000 mg | Freq: Four times a day (QID) | INTRAMUSCULAR | Status: DC | PRN

## 2018-07-18 MED ORDER — FUROSEMIDE 10 MG/ML IJ SOLN
40.0000 mg | Freq: Two times a day (BID) | INTRAMUSCULAR | Status: DC
  Administered 2018-07-18 – 2018-07-20 (×4): 40 mg via INTRAVENOUS
  Filled 2018-07-18 (×5): qty 4

## 2018-07-18 MED ORDER — CARVEDILOL 25 MG PO TABS: 25.0000 mg | ORAL_TABLET | Freq: Two times a day (BID) | ORAL | Status: DC

## 2018-07-18 MED ORDER — AMLODIPINE BESYLATE 10 MG PO TABS
10.0000 mg | ORAL_TABLET | Freq: Every day | ORAL | Status: DC
  Administered 2018-07-18 – 2018-07-20 (×3): 10 mg via ORAL
  Filled 2018-07-18 (×3): qty 1

## 2018-07-18 MED ORDER — ENOXAPARIN SODIUM 40 MG/0.4ML ~~LOC~~ SOLN
40.0000 mg | SUBCUTANEOUS | Status: DC
  Administered 2018-07-18 – 2018-07-19 (×2): 40 mg via SUBCUTANEOUS
  Filled 2018-07-18 (×2): qty 0.4

## 2018-07-18 MED ORDER — COLCHICINE 0.6 MG PO TABS
0.6000 mg | ORAL_TABLET | Freq: Every day | ORAL | Status: DC
  Administered 2018-07-19 – 2018-07-20 (×2): 0.6 mg via ORAL
  Filled 2018-07-18 (×2): qty 1

## 2018-07-18 MED ORDER — SODIUM CHLORIDE 0.9 % IV SOLN: 250.0000 mL | INTRAVENOUS | Status: DC | PRN

## 2018-07-18 MED ORDER — PRAVASTATIN SODIUM 40 MG PO TABS
40.0000 mg | ORAL_TABLET | Freq: Every day | ORAL | Status: DC
  Administered 2018-07-19 – 2018-07-20 (×2): 40 mg via ORAL
  Filled 2018-07-18 (×2): qty 1

## 2018-07-18 MED ORDER — SODIUM CHLORIDE 0.9% FLUSH: 3.0000 mL | INTRAVENOUS | Status: DC | PRN

## 2018-07-18 MED ORDER — ASPIRIN EC 325 MG PO TBEC
325.0000 mg | DELAYED_RELEASE_TABLET | Freq: Once | ORAL | Status: AC
  Administered 2018-07-18: 325 mg via ORAL
  Filled 2018-07-18: qty 1

## 2018-07-18 MED ORDER — CARVEDILOL 25 MG PO TABS
25.0000 mg | ORAL_TABLET | Freq: Two times a day (BID) | ORAL | Status: DC
  Administered 2018-07-18 – 2018-07-20 (×4): 25 mg via ORAL
  Filled 2018-07-18 (×4): qty 1

## 2018-07-18 MED ORDER — ACETAMINOPHEN 325 MG PO TABS: 650.0000 mg | ORAL_TABLET | ORAL | Status: DC | PRN

## 2018-07-18 MED ORDER — SODIUM CHLORIDE 0.9% FLUSH
3.0000 mL | Freq: Two times a day (BID) | INTRAVENOUS | Status: DC
  Administered 2018-07-18 – 2018-07-20 (×5): 3 mL via INTRAVENOUS

## 2018-07-18 NOTE — H&P (Addendum)
History and Physical    [name] [name] ZOX:[id number] DOB: [birth year] DOA: 07/18/2018  PCP: System, Pcp Not In  Patient coming from: Home, visiting [place], pt is from [place].   I have personally briefly reviewed patient's old medical records in [facility]  Chief Complaint: sob since 2 days.   HPI: [name] [name] is a 52 y.o. male with medical history significant of chronic systolic heart failure with last EF around 30 to 35%, hypertension, Gout, depression, was visiting friends and family in [place], presents with worsening sob and chest tight ness since two days associated with worsening orthopnea, pedal edema, and dyspnea on exertion. He reports being compliant to meds, but has been non compliant to salt and fluid intake. He also reports occasional palpitations, with dry cough. Denies any syncopal episodes.  [name] [name] denies any fevers or chills.  He reports occasional nausea yesterday, no nausea or vomiting, diarrhea or abdominal pain.  Patient denies any urinary symptoms, no weakness or tingling or numbness in the hands or feet.  Patient reports he had a normal stress test in the past.  He he never had cardiac catheterization done.  ED Course: On arrival to ED he was found to be dyspneic and lab work revealed sodium of 146, potassium of 3.1, glucose of 173, BUN of 34, creatinine of 1.95, BUN PT of 325, troponin of 0.06.  Urine analysis shows small hemoglobin and rare bacteria.  Chest x-ray showed CHF with associated interstitial edema throughout both lungs. Probable small bilateral pleural effusions.  Cardiomegaly, moderate to marked in degree. EKGs show sinus rhythm, prolonged PR interval, enlarged left atrium, nonspecific T wave abnormalities in the lateral leads.   He was referred to medical service for evaluation and management of acute on chronic systolic heart failure.    Review of Systems: As per HPI  ,All others reviewed and are negative,  Past Medical History:  Diagnosis  Date  . CHF (congestive heart failure) (HCC)   . Depression   . Gout   . Hypertension     Past Surgical History:  Procedure Laterality Date  . APPENDECTOMY       reports that he has never smoked. He has never used smokeless tobacco. He reports that he drinks alcohol. He reports that he has current or past drug history. Drug: Marijuana.  No Known Allergies  Family History  Problem Relation Age of Onset  . Heart failure Mother    Family history reviewed and pertinent  Prior to Admission medications   Not on File    Physical Exam: Vitals:   07/18/18 0937 07/18/18 1030 07/18/18 1232 07/18/18 1235  BP: (!) 157/93 (!) 188/85 (!) 176/111 (!) 176/111  Pulse: 66 80 69   Resp: (!) 21 (!) 21 18   Temp:      TempSrc:      SpO2: 94% 90% 95%   Weight:      Height:        Constitutional: Mild distress from shortness of breath on talking Vitals:   07/18/18 0937 07/18/18 1030 07/18/18 1232 07/18/18 1235  BP: (!) 157/93 (!) 188/85 (!) 176/111 (!) 176/111  Pulse: 66 80 69   Resp: (!) 21 (!) 21 18   Temp:      TempSrc:      SpO2: 94% 90% 95%   Weight:      Height:       Eyes: PERRL, lids and conjunctivae normal ENMT: Mucous membranes are moist.  Neck: normal, supple, no masses, no  thyromegaly Respiratory: Initially air entry at bases scattered scattered rales, tachypnea.  Cardiovascular: Regular rate and rhythm, no murmurs / rubs / gallops.  Trace pedal edema  abdomen: no tenderness, no masses palpated. No hepatosplenomegaly. Bowel sounds positive.  Musculoskeletal: Trace pedal edema, Skin: no rashes, lesions, ulcers. No induration Neurologic: CN 2-12 grossly intact. Sensation intact, DTR normal. Strength 5/5 in all 4.  Psychiatric: Normal judgment and insight. Alert and oriented x 3. Normal mood.     Labs on Admission: I have personally reviewed following labs and imaging studies  CBC: Recent Labs  Lab 07/18/18 1015  WBC 7.8  NEUTROABS 5.0  HGB 14.7  HCT 44.8    MCV 91.1  PLT 230   Basic Metabolic Panel: Recent Labs  Lab 07/18/18 1015  NA 146*  K 3.1*  CL 106  CO2 28  GLUCOSE 173*  BUN 34*  CREATININE 1.95*  CALCIUM 8.7*  MG 1.8   GFR: Estimated Creatinine Clearance: 57.1 mL/min (A) (by C-G formula based on SCr of 1.95 mg/dL (H)). Liver Function Tests: Recent Labs  Lab 07/18/18 1015  AST 20  ALT 17  ALKPHOS 73  BILITOT 0.8  PROT 6.9  ALBUMIN 3.0*   No results for input(s): LIPASE, AMYLASE in the last 168 hours. No results for input(s): AMMONIA in the last 168 hours. Coagulation Profile: No results for input(s): INR, PROTIME in the last 168 hours. Cardiac Enzymes: Recent Labs  Lab 07/18/18 1015  TROPONINI 0.06*   BNP (last 3 results) No results for input(s): PROBNP in the last 8760 hours. HbA1C: No results for input(s): HGBA1C in the last 72 hours. CBG: No results for input(s): GLUCAP in the last 168 hours. Lipid Profile: No results for input(s): CHOL, HDL, LDLCALC, TRIG, CHOLHDL, LDLDIRECT in the last 72 hours. Thyroid Function Tests: No results for input(s): TSH, T4TOTAL, FREET4, T3FREE, THYROIDAB in the last 72 hours. Anemia Panel: No results for input(s): VITAMINB12, FOLATE, FERRITIN, TIBC, IRON, RETICCTPCT in the last 72 hours. Urine analysis:    Component Value Date/Time   COLORURINE YELLOW 07/18/2018 1016   APPEARANCEUR CLEAR 07/18/2018 1016   LABSPEC 1.012 07/18/2018 1016   PHURINE 5.0 07/18/2018 1016   GLUCOSEU NEGATIVE 07/18/2018 1016   HGBUR SMALL (A) 07/18/2018 1016   BILIRUBINUR NEGATIVE 07/18/2018 1016   KETONESUR NEGATIVE 07/18/2018 1016   PROTEINUR 100 (A) 07/18/2018 1016   UROBILINOGEN 0.2 11/19/2008 0224   NITRITE NEGATIVE 07/18/2018 1016   LEUKOCYTESUR NEGATIVE 07/18/2018 1016    Radiological Exams on Admission: Dg Chest 2 View  Result Date: 07/18/2018 CLINICAL DATA:  Chest tightness this morning. EXAM: CHEST - 2 VIEW COMPARISON:  Chest x-ray dated 02/23/2009. FINDINGS: Cardiomegaly,  moderate to marked in degree. Central pulmonary vascular congestion and bilateral interstitial edema, consistent with CHF. Probable small bilateral pleural effusions. No pneumothorax seen. No acute or suspicious osseous finding. IMPRESSION: 1. CHF with associated interstitial edema throughout both lungs. Probable small bilateral pleural effusions. 2. Cardiomegaly, moderate to marked in degree. Electronically Signed   By: Bary Richard M.D.   On: 07/18/2018 10:09    EKG: Independently reviewed.  Sinus rhythm, prolonged PR interval, prolonged left atrial enlargement, nonspecific T wave abnormalities in the lateral leads  Assessment/Plan Active Problems:   Acute CHF (congestive heart failure) (HCC)   Accelerated hypertension   Hyperlipidemia   Hypokalemia   Acute on chronic systolic heart failure Admit to telemetry start IV diuresis with IV Lasix 40 mg twice daily.  Nasal cannula oxygen to keep sats  greater than 90%. Strict intake and output, daily weights. Serial troponins, get echocardiogram to evaluate left ventricular EF. Get a twelve-lead EKG in the morning for follow-up.  Cardiology will be consulted in the morning. Resume Coreg.   Accelerated hypertension Resume amlodipine Coreg, hydralazine.    Acute on stage III CKD  patient's previous creatinine at 1.5 in 2010 currently it is 1.95, recommend to hold the Cozaar for now.  Not sure if it is 1.9 is his new baseline.  Recheck renal para meters in the morning.   Hypokalemia Replace as needed repeat electrolytes in the morning.   Gout Resume allopurinol and colchicine as needed.    Hyper lipidemia resume Pravachol    DVT prophylaxis: Lovenox Code Status: Full code Family Communication: None at bedside Disposition Plan: Pending resolution of heart failure Consults called: Cardiology will be consulted in the morning Admission status: Inpatient/telemetry   Kathlen Mody MD [facility] Pager [phone]  If  7PM-7AM, please contact night-coverage [email or web address] Password [email or web address]  07/18/2018, 12:46 PM

## 2018-07-18 NOTE — ED Notes (Addendum)
Date and time results received: 07/18/18 12:06 PM   Test: troponin Critical Value: 0.06  Name of Provider Notified: Blake Divine MD  Orders Received? Or Actions Taken?: made aware face to face communication

## 2018-07-18 NOTE — ED Notes (Signed)
Patient transported to X-ray 

## 2018-07-18 NOTE — ED Notes (Signed)
ED TO INPATIENT HANDOFF REPORT  Name/Age/Gender [name] [name] 52 y.o. male  Code Status   Home/SNF/Other Home    Chief Complaint shob;tightness in chest;cough  Level of Care/Admitting Diagnosis ED Disposition    ED Disposition Condition [place]: [employer] [100102]  Level of Care: Telemetry [5]  Admit to tele based on following criteria: Acute CHF  Diagnosis: Acute CHF (congestive heart failure) [facility]) [450388]  Admitting Physician: Hosie Poisson [[id number]]  Attending Physician: Hosie Poisson [[id number]]  Estimated length of stay: past midnight tomorrow  Certification:: I certify this patient will need inpatient services for at least 2 midnights  PT Class (Do Not Modify): Inpatient [101]  PT Acc Code (Do Not Modify): Private [1]       Medical History Past Medical History:  Diagnosis Date  . CHF (congestive heart failure) ([place])   . Depression   . Gout   . Hypertension     Allergies No Known Allergies  IV Location/Drains/Wounds Patient Lines/Drains/Airways Status   Active Line/Drains/Airways    Name:   Placement date:   Placement time:   Site:   Days:   Peripheral IV 07/18/18 Right Hand   07/18/18    1013    Hand   less than 1          Labs/Imaging Results for orders placed or performed during the hospital encounter of 07/18/18 (from the past 48 hour(s))  Magnesium     Status: None   Collection Time: 07/18/18 10:15 AM  Result Value Ref Range   Magnesium 1.8 1.7 - 2.4 mg/dL    Comment: Performed at [facility], [place] [address] [place], [place] [address]  Comprehensive metabolic panel     Status: Abnormal   Collection Time: 07/18/18 10:15 AM  Result Value Ref Range   Sodium 146 (H) 135 - 145 mmol/L   Potassium 3.1 (L) 3.5 - 5.1 mmol/L   Chloride 106 98 - 111 mmol/L   CO2 28 22 - 32 mmol/L   Glucose, Bld 173 (H) 70 - 99 mg/dL   BUN 34 (H) 6 - 20 mg/dL   Creatinine, Ser 1.95 (H) 0.61 - 1.24 mg/dL   Calcium 8.7 (L) 8.9 - 10.3 mg/dL   Total Protein 6.9 6.5 - 8.1 g/dL   Albumin 3.0 (L) 3.5 - 5.0 g/dL   AST 20 15 - 41 U/L   ALT 17 0 - 44 U/L   Alkaline Phosphatase 73 38 - 126 U/L   Total Bilirubin 0.8 0.3 - 1.2 mg/dL   GFR calc non Af Amer 37 (L) >60 mL/min   GFR calc Af Amer 43 (L) >60 mL/min    Comment: (NOTE) The eGFR has been calculated using the CKD EPI equation. This calculation has not been validated in all clinical situations. eGFR's persistently <60 mL/min signify possible Chronic Kidney Disease.    Anion gap 12 5 - 15    Comment: Performed at [facility], [place] [address], [place], [place] [address]  CBC with Differential/Platelet     Status: Abnormal   Collection Time: 07/18/18 10:15 AM  Result Value Ref Range   WBC 7.8 4.0 - 10.5 K/uL   RBC 4.92 4.22 - 5.81 MIL/uL   Hemoglobin 14.7 13.0 - 17.0 g/dL   HCT 44.8 39.0 - 52.0 %   MCV 91.1 78.0 - 100.0 fL   MCH 29.9 26.0 - 34.0 pg   MCHC 32.8 30.0 - 36.0 g/dL  RDW 15.9 (H) 11.5 - 15.5 %   Platelets 230 150 - 400 K/uL   Neutrophils Relative % 64 %   Neutro Abs 5.0 1.7 - 7.7 K/uL   Lymphocytes Relative 19 %   Lymphs Abs 1.5 0.7 - 4.0 K/uL   Monocytes Relative 5 %   Monocytes Absolute 0.4 0.1 - 1.0 K/uL   Eosinophils Relative 11 %   Eosinophils Absolute 0.9 (H) 0.0 - 0.7 K/uL   Basophils Relative 1 %   Basophils Absolute 0.0 0.0 - 0.1 K/uL    Comment: Performed at [facility], [place] [address] [place], [place] [address]  Troponin I     Status: Abnormal   Collection Time: 07/18/18 10:15 AM  Result Value Ref Range   Troponin I 0.06 (HH) <0.03 ng/mL    Comment: CRITICAL RESULT CALLED TO, READ BACK BY AND VERIFIED WITH: ROSSER,M. RN AT 1205 07/18/18 MULLINS,T Performed at [facility], [place] [address] [place], [place] [address]   Brain natriuretic peptide (order ONLY if patient c/o SOB)     Status: Abnormal   Collection Time: 07/18/18 10:16 AM  Result Value  Ref Range   B Natriuretic Peptide 325.3 (H) 0.0 - 100.0 pg/mL    Comment: Performed at [facility], [place] [address], [place], [place] [address]  Urinalysis, Routine w reflex microscopic     Status: Abnormal   Collection Time: 07/18/18 10:16 AM  Result Value Ref Range   Color, Urine YELLOW YELLOW   APPearance CLEAR CLEAR   Specific Gravity, Urine 1.012 1.005 - 1.030   pH 5.0 5.0 - 8.0   Glucose, UA NEGATIVE NEGATIVE mg/dL   Hgb urine dipstick SMALL (A) NEGATIVE   Bilirubin Urine NEGATIVE NEGATIVE   Ketones, ur NEGATIVE NEGATIVE mg/dL   Protein, ur 100 (A) NEGATIVE mg/dL   Nitrite NEGATIVE NEGATIVE   Leukocytes, UA NEGATIVE NEGATIVE   RBC / HPF 0-5 0 - 5 RBC/hpf   WBC, UA 0-5 0 - 5 WBC/hpf   Bacteria, UA RARE (A) NONE SEEN   Hyaline Casts, UA PRESENT     Comment: Performed at [facility], [place] [address], [place],  [address]  I-stat troponin, ED     Status: None   Collection Time: 07/18/18 10:21 AM  Result Value Ref Range   Troponin i, poc 0.06 0.00 - 0.08 ng/mL   Comment 3            Comment: Due to the release kinetics of cTnI, a negative result within the first hours of the onset of symptoms does not rule out myocardial infarction with certainty. If myocardial infarction is still suspected, repeat the test at appropriate intervals.    Dg Chest 2 View  Result Date: 07/18/2018 CLINICAL DATA:  Chest tightness this morning. EXAM: CHEST - 2 VIEW COMPARISON:  Chest x-ray dated 02/23/2009. FINDINGS: Cardiomegaly, moderate to marked in degree. Central pulmonary vascular congestion and bilateral interstitial edema, consistent with CHF. Probable small bilateral pleural effusions. No pneumothorax seen. No acute or suspicious osseous finding. IMPRESSION: 1. CHF with associated interstitial edema throughout both lungs. Probable small bilateral pleural effusions. 2. Cardiomegaly, moderate to marked in degree. Electronically Signed   By: Franki Cabot M.D.   On: 07/18/2018 10:09    Pending Labs Unresulted Labs (From admission, onward)    Start     Ordered   07/19/18 0500  Lipid panel  Tomorrow morning,   R     07/18/18  1134   07/19/18 0500  Hemoglobin A1c  Tomorrow morning,   R     07/18/18 1134   07/18/18 1135  Troponin I  Now then every 6 hours,   R     07/18/18 1134   Signed and Held  HIV antibody (Routine Testing)  Once,   R     Signed and Held   Signed and Held  Basic metabolic panel  Daily,   R     Signed and Held   Signed and Held  Creatinine, serum  (enoxaparin (LOVENOX)    CrCl < 30 ml/min)  Weekly,   R    Comments:  while on enoxaparin therapy.    Signed and Held          Vitals/Pain Today's Vitals   07/18/18 0929 07/18/18 0931 07/18/18 0937 07/18/18 1030  BP:   (!) 157/93 (!) 188/85  Pulse:   66 80  Resp:   (!) 21 (!) 21  Temp:      TempSrc:      SpO2:   94% 90%  Weight:  120.2 kg    Height:  5' 11"  (1.803 m)    PainSc: 3        Isolation Precautions No active isolations  Medications Medications  hydrALAZINE (APRESOLINE) injection 10 mg (has no administration in time range)  aspirin EC tablet 325 mg (325 mg Oral Given 07/18/18 1021)  furosemide (LASIX) injection 40 mg (40 mg Intravenous Given 07/18/18 1117)  potassium chloride SA (K-DUR,KLOR-CON) CR tablet 40 mEq (40 mEq Oral Given 07/18/18 1116)    Mobility walks

## 2018-07-18 NOTE — Progress Notes (Signed)
CRITICAL VALUE ALERT  Critical Value:  Trop 0.07  Date & Time Notied:  07/18/18  Provider Notified: 1821  Orders Received/Actions taken: Dr Blake Divine informed. Will continue to monitor.

## 2018-07-18 NOTE — ED Provider Notes (Signed)
[facility]-EMERGENCY DEPT Provider Note   CSN: [id number] Arrival date & time: 07/18/18  0909     History   Chief Complaint Chief Complaint  Patient presents with  . Shortness of Breath    HPI [name] [name] is a 52 y.o. male.  52 year old male, with PMH of HTN, CHF, presents with dyspnea on exertion and orthopnea 2 days.  Patient states he lives in [place], [place] where he is treated for his CHF.  He states his "heart functions at 30 to 35%."  He states 3 days ago, before traveling up to [place], he was seen at an emergency room in [place].  He states he was given Lasix and discharge.  He notes however worsening symptoms including orthopnea, PND and dyspnea on exertion.  He states he becomes winded when trying to walk the stairs which he can normally tolerate.  He notes he did eat chicken yesterday which was very salty.  Otherwise he denies any salty meals.  He states he has been compliant with his medications. He states one episode of chest pain when he bent down to tie his shoes today but it resolved as soon as he stood up.  He denies any peripheral edema, cough, sputum production, fevers.  He states a history of CHF exacerbations and states that his symptoms today feel the same as previous CHF exacerbations.  The history is provided by the patient.  Shortness of Breath  The current episode started 2 days ago. Associated symptoms include chest pain. Pertinent negatives include no fever, no rhinorrhea, no sore throat, no neck pain, no cough, no vomiting, no abdominal pain, no rash and no leg swelling.    Past Medical History:  Diagnosis Date  . CHF (congestive heart failure) (HCC)   . Depression   . Gout   . Hypertension     Patient Active Problem List   Diagnosis Date Noted  . HYPERLIPIDEMIA-MIXED 10/27/2008  . HYPERTENSION, HEART UNCONTROLLED W/ CHF 10/27/2008  . CONGESTIVE HEART FAILURE, UNSPECIFIED 10/27/2008    Past Surgical History:    Procedure Laterality Date  . APPENDECTOMY          Home Medications    Prior to Admission medications   Not on File    Family History Family History  Problem Relation Age of Onset  . Heart failure Mother     Social History Social History   Tobacco Use  . Smoking status: Never Smoker  . Smokeless tobacco: Never Used  Substance Use Topics  . Alcohol use: Yes    Comment: occasionally  . Drug use: Yes    Types: Marijuana    Comment: daily     Allergies   Patient has no known allergies.   Review of Systems Review of Systems  Constitutional: Negative for chills and fever.  HENT: Negative for rhinorrhea and sore throat.   Respiratory: Positive for shortness of breath. Negative for cough.        Positive for: DOE, orthopnea  Cardiovascular: Positive for chest pain. Negative for leg swelling.  Gastrointestinal: Negative for abdominal pain, diarrhea, nausea and vomiting.  Genitourinary: Negative for dysuria, frequency and urgency.  Musculoskeletal: Negative for joint swelling and neck pain.  Skin: Negative for rash and wound.  Neurological: Negative for syncope and numbness.  All other systems reviewed and are negative.    Physical Exam Updated Vital Signs BP (!) 157/93   Pulse 66   Temp 97.7 F (36.5 C) (Oral)   Resp (!) 21  Ht 5\' 11"  (1.803 m)   Wt 120.2 kg   SpO2 94%   BMI 36.96 kg/m   Physical Exam  Constitutional: He is oriented to person, place, and time. He appears well-developed and well-nourished.  HENT:  Head: Normocephalic and atraumatic.  Eyes: Conjunctivae and EOM are normal.  Neck: Neck supple.  Cardiovascular: Normal rate, regular rhythm and normal heart sounds.  No murmur heard. Pulmonary/Chest: Effort normal and breath sounds normal. No accessory muscle usage. No tachypnea. No respiratory distress. He has no wheezes. He has no rales.  Exam limited due to body habitus  Abdominal: Soft. Bowel sounds are normal. He exhibits no  distension. There is no tenderness.  Musculoskeletal: Normal range of motion. He exhibits no tenderness or deformity.       Right lower leg: He exhibits edema (trace).       Left lower leg: He exhibits edema (trace).  Neurological: He is alert and oriented to person, place, and time.  Skin: Skin is warm and dry. No rash noted. No erythema.  Psychiatric: He has a normal mood and affect. His behavior is normal.  Nursing note and vitals reviewed.    ED Treatments / Results  Labs (all labs ordered are listed, but only abnormal results are displayed) Labs Reviewed  MAGNESIUM  BRAIN NATRIURETIC PEPTIDE  COMPREHENSIVE METABOLIC PANEL  CBC WITH DIFFERENTIAL/PLATELET  URINALYSIS, ROUTINE W REFLEX MICROSCOPIC  I-STAT TROPONIN, ED    EKG EKG Interpretation  Date/Time:  Friday July 18 2018 09:18:40 EDT Ventricular Rate:  71 PR Interval:    QRS Duration: 114 QT Interval:  455 QTC Calculation: 495 R Axis:   -94 Text Interpretation:  Sinus rhythm Prolonged PR interval Probable left atrial enlargement Left anterior fascicular block Abnormal R-wave progression, late transition Abnormal T, consider ischemia, lateral leads No significant change since last tracing Confirmed by Derwood Kaplan [phone]) on 07/18/2018 9:41:27 AM   Radiology Dg Chest 2 View  Result Date: 07/18/2018 CLINICAL DATA:  Chest tightness this morning. EXAM: CHEST - 2 VIEW COMPARISON:  Chest x-ray dated 02/23/2009. FINDINGS: Cardiomegaly, moderate to marked in degree. Central pulmonary vascular congestion and bilateral interstitial edema, consistent with CHF. Probable small bilateral pleural effusions. No pneumothorax seen. No acute or suspicious osseous finding. IMPRESSION: 1. CHF with associated interstitial edema throughout both lungs. Probable small bilateral pleural effusions. 2. Cardiomegaly, moderate to marked in degree. Electronically Signed   By: Bary Richard M.D.   On: 07/18/2018 10:09    Procedures Procedures  (including critical care time)  Medications Ordered in ED Medications  aspirin EC tablet 325 mg (has no administration in time range)     Initial Impression / Assessment and Plan / ED Course  I have reviewed the triage vital signs and the nursing notes.  Pertinent labs & imaging results that were available during my care of the patient were reviewed by me and considered in my medical decision making (see chart for details).     History and physical most consistent with CHF exacerbation.  Discussed with Dr. Rhunette Croft, who recommends admission. He will admit pt.  Final Clinical Impressions(s) / ED Diagnoses   Final diagnoses:  None    ED Discharge Orders    None       Clayborne Artist, PA-C 07/18/18 1635    Derwood Kaplan, MD 07/19/18 [phone]

## 2018-07-18 NOTE — ED Triage Notes (Signed)
Patient reports a history of CHF. Patient was seen in [facility] ED for the same and states he was discharged after receiving Lasix. Patient states he has been sleeping on 2-3 pillows and had chest tightness this morning. Patient has abdominal distention.

## 2018-07-18 NOTE — Progress Notes (Signed)
  Echocardiogram 2D Echocardiogram has been performed.  [name] [name] 07/18/2018, 2:54 PM

## 2018-07-18 NOTE — Care Management Note (Signed)
Case Management Note  Patient Details  Name: [name] [name] MRN: [id number] Date of Birth: 1966-09-28  Subjective/Objective: Admitted w/CHF. From home. CM referral for CHF screen-1 adm/past 6 months.continue to monitor if any d/c needs.                    Action/Plan:d/c home.   Expected Discharge Date:  (unknown)               Expected Discharge Plan:  Home/Self Care  In-House Referral:     Discharge planning Services  CM Consult  Post Acute Care Choice:    Choice offered to:     DME Arranged:    DME Agency:     HH Arranged:    HH Agency:     Status of Service:  In process, will continue to follow  If discussed at Long Length of Stay Meetings, dates discussed:    Additional Comments:  Lanier Clam, RN 07/18/2018, 3:22 PM

## 2018-07-19 ENCOUNTER — Inpatient Hospital Stay (HOSPITAL_COMMUNITY): Payer: Self-pay

## 2018-07-19 DIAGNOSIS — I5023 Acute on chronic systolic (congestive) heart failure: Secondary | ICD-10-CM

## 2018-07-19 LAB — BASIC METABOLIC PANEL
ANION GAP: 13 (ref 5–15)
BUN: 27 mg/dL — ABNORMAL HIGH (ref 6–20)
CHLORIDE: 100 mmol/L (ref 98–111)
CO2: 31 mmol/L (ref 22–32)
Calcium: 8.4 mg/dL — ABNORMAL LOW (ref 8.9–10.3)
Creatinine, Ser: 1.76 mg/dL — ABNORMAL HIGH (ref 0.61–1.24)
GFR calc Af Amer: 49 mL/min — ABNORMAL LOW (ref >60)
GFR calc non Af Amer: 42 mL/min — ABNORMAL LOW (ref >60)
GLUCOSE: 142 mg/dL — AB (ref 70–99)
POTASSIUM: 2.8 mmol/L — AB (ref 3.5–5.1)
Sodium: 144 mmol/L (ref 135–145)

## 2018-07-19 LAB — LIPID PANEL
Cholesterol: 209 mg/dL — ABNORMAL HIGH (ref 0–200)
HDL: 32 mg/dL — ABNORMAL LOW (ref >40)
LDL Cholesterol: 137 mg/dL — ABNORMAL HIGH (ref 0–99)
Total CHOL/HDL Ratio: 6.5 ratio
Triglycerides: 198 mg/dL — ABNORMAL HIGH (ref <150)
VLDL: 40 mg/dL (ref 0–40)

## 2018-07-19 LAB — TROPONIN I: TROPONIN I: 0.07 ng/mL — AB (ref <0.03)

## 2018-07-19 LAB — HIV ANTIBODY (ROUTINE TESTING W REFLEX): HIV Screen 4th Generation wRfx: NONREACTIVE

## 2018-07-19 MED ORDER — POTASSIUM CHLORIDE CRYS ER 20 MEQ PO TBCR
40.0000 meq | EXTENDED_RELEASE_TABLET | Freq: Once | ORAL | Status: AC
  Administered 2018-07-19: 40 meq via ORAL
  Filled 2018-07-19: qty 2

## 2018-07-19 MED ORDER — CLONIDINE HCL 0.2 MG PO TABS
0.2000 mg | ORAL_TABLET | Freq: Three times a day (TID) | ORAL | Status: DC
  Administered 2018-07-19 – 2018-07-20 (×4): 0.2 mg via ORAL
  Filled 2018-07-19 (×4): qty 1

## 2018-07-19 MED ORDER — CLONIDINE HCL 0.2 MG PO TABS
0.2000 mg | ORAL_TABLET | Freq: Once | ORAL | Status: AC
  Administered 2018-07-19: 0.2 mg via ORAL
  Filled 2018-07-19: qty 1

## 2018-07-19 NOTE — Progress Notes (Signed)
Patient ambulatory in hallway, maintained sats 95% on room air.  Patient did c/o shortness of breath after ambulation.

## 2018-07-19 NOTE — Progress Notes (Signed)
SATURATION QUALIFICATIONS: (This note is used to comply with regulatory documentation for home oxygen)  Patient Saturations on Room Air at Rest = 97%   Patient Saturations on Room Air while Ambulating = 95%   Patient Saturations on n/a Liters of oxygen while Ambulating = n/a  Please briefly explain why patient needs home oxygen: patient does not meet requirements for home oxygen

## 2018-07-19 NOTE — Progress Notes (Signed)
PROGRESS NOTE    [name] [name]  ZOX:[id number] DOB: 1966-07-30 DOA: 07/18/2018 PCP: System, Pcp Not In  Brief Narrative:52 y.o. male with medical history significant of chronic systolic heart failure with last EF around 30 to 35%, hypertension, Gout, depression, was visiting friends and family in [place], presents with worsening sob and chest tight ness since two days associated with worsening orthopnea, pedal edema, and dyspnea on exertion. He reports being compliant to meds, but has been non compliant to salt and fluid intake. He also reports occasional palpitations, with dry cough. Denies any syncopal episodes.  [name] [name] denies any fevers or chills.  He reports occasional nausea yesterday, no nausea or vomiting, diarrhea or abdominal pain.  Patient denies any urinary symptoms, no weakness or tingling or numbness in the hands or feet.  Patient reports he had a normal stress test in the past.  He he never had cardiac catheterization done.  ED Course: On arrival to ED he was found to be dyspneic and lab work revealed sodium of 146, potassium of 3.1, glucose of 173, BUN of 34, creatinine of 1.95, BUN PT of 325, troponin of 0.06.  Urine analysis shows small hemoglobin and rare bacteria.  Chest x-ray showed CHF with associated interstitial edema throughout both lungs. Probable small bilateral pleural effusions.  Cardiomegaly, moderate to marked in degree. EKGs show sinus rhythm, prolonged PR interval, enlarged left atrium, nonspecific T wave abnormalities in the lateral leads.   He was referred to medical service for evaluation and management of acute on chronic systolic heart failure. Assessment & Plan:   Active Problems:   Acute CHF (congestive heart failure) (HCC)   Accelerated hypertension   Hyperlipidemia   Hypokalemia  1] acute systolic heart failure-patient admitted with increasing weight gain shortness of breath and chest tightness.  Patient is visiting [place] .he reports that he does  not take his medications as prescribed so his blood pressure remains high all the time.  Echocardiogram done yesterday showed no significant change in the left ventricular ejection fraction compared to prior study in 2010.  His ejection fraction was 30 to 35% with diffuse hypokinesis.  Chest x-ray consistent with cardiomegaly and congestive heart failure.  Continue IV Lasix twice a day.  He is negative by 11.8 L since admission.  However his recorded weight is 129 kg which is up from 120 kg on admission.  This clearly does not make any sense.  Ambulate patient check pulse ox after ambulation.  2] hypertension continue Norvasc 10 mg daily, Coreg 25 mg twice a day, hydralazine PRN, I have added clonidine which he normally takes at home but have increased the dose to 3 times a day.  3] AKI on CKD stage III ACE inhibitor on hold monitor.  4] severe hypokalemia with a K of 2.8 replete and recheck.  5] history of gout stable continue home medications.    DVT prophylaxis: Lovenox code Status full code Family Communication none Disposition Plan: TBD Consultants: None   Procedures: None Antimicrobials: None  Subjective: Feels better currently on oxygen denies any chest pain breathing better after IV Lasix.  Objective: Vitals:   07/19/18 0524 07/19/18 0655 07/19/18 0812 07/19/18 0828  BP: (!) 154/102 (!) 181/106  (!) 161/107  Pulse: 66   68  Resp: 18     Temp: 98.1 F (36.7 C)     TempSrc: Oral     SpO2: 93%  97% 97%  Weight: 129.1 kg     Height:  Intake/Output Summary (Last 24 hours) at 07/19/2018 1256 Last data filed at 07/19/2018 1126 Gross per 24 hour  Intake 222 ml  Output 11650 ml  Net -11428 ml   Filed Weights   07/18/18 0931 07/19/18 0524  Weight: 120.2 kg 129.1 kg    Examination:  General exam: Appears calm and comfortable  Respiratory system: Crackles bilateral bases to auscultation. Respiratory effort normal. Cardiovascular system: S1 & S2 heard, RRR. No JVD,  murmurs, rubs, gallops or clicks. No pedal edema. Gastrointestinal system: Abdomen is nondistended, soft and nontender. No organomegaly or masses felt. Normal bowel sounds heard. Central nervous system: Alert and oriented. No focal neurological deficits. Extremities: Symmetric 5 x 5 power. Skin: No rashes, lesions or ulcers Psychiatry: Judgement and insight appear normal. Mood & affect appropriate.     Data Reviewed: I have personally reviewed following labs and imaging studies  CBC: Recent Labs  Lab 07/18/18 1015  WBC 7.8  NEUTROABS 5.0  HGB 14.7  HCT 44.8  MCV 91.1  PLT 230   Basic Metabolic Panel: Recent Labs  Lab 07/18/18 1015 07/19/18 0514  NA 146* 144  K 3.1* 2.8*  CL 106 100  CO2 28 31  GLUCOSE 173* 142*  BUN 34* 27*  CREATININE 1.95* 1.76*  CALCIUM 8.7* 8.4*  MG 1.8  --    GFR: Estimated Creatinine Clearance: 65.7 mL/min (A) (by C-G formula based on SCr of 1.76 mg/dL (H)). Liver Function Tests: Recent Labs  Lab 07/18/18 1015  AST 20  ALT 17  ALKPHOS 73  BILITOT 0.8  PROT 6.9  ALBUMIN 3.0*   No results for input(s): LIPASE, AMYLASE in the last 168 hours. No results for input(s): AMMONIA in the last 168 hours. Coagulation Profile: No results for input(s): INR, PROTIME in the last 168 hours. Cardiac Enzymes: Recent Labs  Lab 07/18/18 1015 07/18/18 1720 07/18/18 2319  TROPONINI 0.06* 0.07* 0.07*   BNP (last 3 results) No results for input(s): PROBNP in the last 8760 hours. HbA1C: No results for input(s): HGBA1C in the last 72 hours. CBG: No results for input(s): GLUCAP in the last 168 hours. Lipid Profile: Recent Labs    07/19/18 0514  CHOL 209*  HDL 32*  LDLCALC 137*  TRIG 198*  CHOLHDL 6.5   Thyroid Function Tests: No results for input(s): TSH, T4TOTAL, FREET4, T3FREE, THYROIDAB in the last 72 hours. Anemia Panel: No results for input(s): VITAMINB12, FOLATE, FERRITIN, TIBC, IRON, RETICCTPCT in the last 72 hours. Sepsis Labs: No  results for input(s): PROCALCITON, LATICACIDVEN in the last 168 hours.  No results found for this or any previous visit (from the past 240 hour(s)).       Radiology Studies: Dg Chest 2 View  Result Date: 07/19/2018 CLINICAL DATA:  CHF, shortness of Breath EXAM: CHEST - 2 VIEW COMPARISON:  07/18/2018 FINDINGS: Cardiomegaly with vascular congestion. Mild peribronchial thickening and interstitial prominence could reflect early interstitial edema. No effusions or acute bony abnormality. IMPRESSION: Cardiomegaly with vascular congestion and mild interstitial prominence which could reflect early interstitial edema. This is slightly improved since prior study. Electronically Signed   By: Charlett Nose M.D.   On: 07/19/2018 08:49   Dg Chest 2 View  Result Date: 07/18/2018 CLINICAL DATA:  Chest tightness this morning. EXAM: CHEST - 2 VIEW COMPARISON:  Chest x-ray dated 02/23/2009. FINDINGS: Cardiomegaly, moderate to marked in degree. Central pulmonary vascular congestion and bilateral interstitial edema, consistent with CHF. Probable small bilateral pleural effusions. No pneumothorax seen. No acute or suspicious  osseous finding. IMPRESSION: 1. CHF with associated interstitial edema throughout both lungs. Probable small bilateral pleural effusions. 2. Cardiomegaly, moderate to marked in degree. Electronically Signed   By: Bary Richard M.D.   On: 07/18/2018 10:09        Scheduled Meds: . allopurinol  100 mg Oral Q breakfast  . amLODipine  10 mg Oral Q breakfast  . carvedilol  25 mg Oral BID WC  . cloNIDine  0.2 mg Oral TID  . colchicine  0.6 mg Oral Q breakfast  . enoxaparin (LOVENOX) injection  40 mg Subcutaneous Q24H  . furosemide  40 mg Intravenous Q12H  . pravastatin  40 mg Oral QPC breakfast  . sodium chloride flush  3 mL Intravenous Q12H   Continuous Infusions: . sodium chloride       LOS: 1 day     Alwyn Ren, MD [facility] If 7PM-7AM, please contact  night-coverage [email or web address] Password [email or web address] 07/19/2018, 12:56 PM

## 2018-07-20 LAB — BASIC METABOLIC PANEL
Anion gap: 11 (ref 5–15)
BUN: 28 mg/dL — ABNORMAL HIGH (ref 6–20)
CHLORIDE: 101 mmol/L (ref 98–111)
CO2: 29 mmol/L (ref 22–32)
CREATININE: 1.83 mg/dL — AB (ref 0.61–1.24)
Calcium: 8.4 mg/dL — ABNORMAL LOW (ref 8.9–10.3)
GFR calc Af Amer: 47 mL/min — ABNORMAL LOW (ref >60)
GFR calc non Af Amer: 40 mL/min — ABNORMAL LOW (ref >60)
Glucose, Bld: 117 mg/dL — ABNORMAL HIGH (ref 70–99)
Potassium: 3.1 mmol/L — ABNORMAL LOW (ref 3.5–5.1)
Sodium: 141 mmol/L (ref 135–145)

## 2018-07-20 LAB — MAGNESIUM: Magnesium: 1.6 mg/dL — ABNORMAL LOW (ref 1.7–2.4)

## 2018-07-20 LAB — HEMOGLOBIN A1C
HEMOGLOBIN A1C: 6.6 % — AB (ref 4.8–5.6)
MEAN PLASMA GLUCOSE: 143 mg/dL

## 2018-07-20 MED ORDER — CLONIDINE HCL 0.1 MG PO TABS: 0.1000 mg | ORAL_TABLET | Freq: Two times a day (BID) | ORAL | 11 refills | Status: AC

## 2018-07-20 MED ORDER — POTASSIUM CHLORIDE CRYS ER 20 MEQ PO TBCR
40.0000 meq | EXTENDED_RELEASE_TABLET | ORAL | Status: AC
  Administered 2018-07-20 (×2): 40 meq via ORAL
  Filled 2018-07-20 (×2): qty 2

## 2018-07-20 MED ORDER — POTASSIUM CHLORIDE ER 10 MEQ PO TBCR: 20.0000 meq | EXTENDED_RELEASE_TABLET | Freq: Two times a day (BID) | ORAL | 0 refills | Status: AC

## 2018-07-20 NOTE — Discharge Summary (Signed)
Physician Discharge Summary  [name] [name] ZOX:[id number] DOB: 03/13/1966 DOA: 07/18/2018  PCP: System, Pcp Not In  Admit date: 07/18/2018 Discharge date: 07/20/2018  Admitted From: home Disposition:  home Recommendations for Outpatient Follow-up:  1. Follow up with PCP in 1-2 weeks 2. Please obtain BMP/CBC in one week  Home Health none Equipment/Devices none  Discharge Condition stable CODE STATUS:full Diet recommendation: cardiac Brief/Interim Summary:52 y.o.malewith medical history significant ofchronic systolic heart failure with last EF around 30 to 35%, hypertension, Gout, depression, was visiting friends and family in [place], presents with worsening sob and chest tight ness since two days associated with worsening orthopnea, pedal edema, and dyspnea on exertion. He reports being compliant to meds, but has been non compliant to salt and fluid intake. He also reports occasional palpitations, with dry cough. Denies any syncopal episodes.[name] [name] denies any fevers or chills. He reports occasional nausea yesterday, no nausea or vomiting, diarrhea or abdominal pain. Patient denies any urinary symptoms,no weakness or tingling or numbness in the hands or feet. Patient reports he had a normal stress test in the past. He he never had cardiac catheterization done.  ED Course:On arrival to ED he was found to be dyspneic and lab work revealed sodium of 146, potassium of 3.1, glucose of 173, BUN of 34, creatinine of 1.95, BUN PT of 325, troponin of 0.06. Urine analysis shows small hemoglobin and rare bacteria.Chest x-ray showed CHF with associated interstitial edema throughout both lungs. Probable small bilateral pleural effusions. Cardiomegaly, moderate to marked in degree. EKGs show sinus rhythm, prolonged PR interval, enlarged left atrium, nonspecific T wave abnormalities in the lateral leads. Discharge Diagnoses:  Active Problems:   Acute CHF (congestive heart failure) (HCC)  Accelerated hypertension   Hyperlipidemia   Hypokalemia   Acute on chronic systolic congestive heart failure (HCC)  1] acute systolic heart failure-patient admitted with increasing weight gain shortness of breath and chest tightness.  Patient is visiting [place] .he reports that he does not take his medications as prescribed so his blood pressure remains high all the time.  Echocardiogram done yesterday showed no significant change in the left ventricular ejection fraction compared to prior study in 2010.  His ejection fraction was 30 to 35% with diffuse hypokinesis.  Chest x-ray consistent with cardiomegaly and congestive heart failure.  Continue Lasix twice a day.  Saturation after ambulation above 95% on the day of discharge.  2] hypertension continue Norvasc 10 mg daily, Coreg 25 mg twice a day, clonidine and Lasix.  Blood pressure much improved.  Continue ACE inhibitor.  3] AKI on CKD stage III stable continue ACE inhibitor.  4] hypokalemia repleted recheck as an outpatient.  5] history of gout stable continue home medications.  Discharge Instructions  Discharge Instructions    Call MD for:  difficulty breathing, headache or visual disturbances   Complete by:  As directed    Call MD for:  persistant dizziness or light-headedness   Complete by:  As directed    Call MD for:  persistant nausea and vomiting   Complete by:  As directed    Call MD for:  temperature >100.4   Complete by:  As directed    Diet - low sodium heart healthy   Complete by:  As directed    Increase activity slowly   Complete by:  As directed      Allergies as of 07/20/2018   No Known Allergies     Medication List    TAKE these medications   allopurinol  100 MG tablet Commonly known as:  ZYLOPRIM Take 100 mg by mouth daily with breakfast.   amLODipine 10 MG tablet Commonly known as:  NORVASC Take 10 mg by mouth daily with breakfast.   carvedilol 25 MG tablet Commonly known as:  COREG Take 25  mg by mouth 2 (two) times daily with a meal.   cloNIDine 0.1 MG tablet Commonly known as:  CATAPRES Take 0.1 mg by mouth 2 (two) times daily.   colchicine 0.6 MG tablet Take 0.6 mg by mouth daily with breakfast.   furosemide 40 MG tablet Commonly known as:  LASIX Take 40 mg by mouth 2 (two) times daily.   losartan 100 MG tablet Commonly known as:  COZAAR Take 100 mg by mouth daily with breakfast.   potassium chloride 10 MEQ tablet Commonly known as:  K-DUR Take 2 tablets (20 mEq total) by mouth 2 (two) times daily.   pravastatin 40 MG tablet Commonly known as:  PRAVACHOL Take 40 mg by mouth daily after breakfast.       No Known Allergies  Consultations: None Procedures/Studies: Dg Chest 2 View  Result Date: 07/19/2018 CLINICAL DATA:  CHF, shortness of Breath EXAM: CHEST - 2 VIEW COMPARISON:  07/18/2018 FINDINGS: Cardiomegaly with vascular congestion. Mild peribronchial thickening and interstitial prominence could reflect early interstitial edema. No effusions or acute bony abnormality. IMPRESSION: Cardiomegaly with vascular congestion and mild interstitial prominence which could reflect early interstitial edema. This is slightly improved since prior study. Electronically Signed   By: Charlett Nose M.D.   On: 07/19/2018 08:49   Dg Chest 2 View  Result Date: 07/18/2018 CLINICAL DATA:  Chest tightness this morning. EXAM: CHEST - 2 VIEW COMPARISON:  Chest x-ray dated 02/23/2009. FINDINGS: Cardiomegaly, moderate to marked in degree. Central pulmonary vascular congestion and bilateral interstitial edema, consistent with CHF. Probable small bilateral pleural effusions. No pneumothorax seen. No acute or suspicious osseous finding. IMPRESSION: 1. CHF with associated interstitial edema throughout both lungs. Probable small bilateral pleural effusions. 2. Cardiomegaly, moderate to marked in degree. Electronically Signed   By: Bary Richard M.D.   On: 07/18/2018 10:09    (Echo, Carotid,  EGD, Colonoscopy, ERCP)    Subjective: He is resting in bed on room air in no acute distress anxious to go home   Discharge Exam: Vitals:   07/19/18 2128 07/20/18 0404  BP: (!) 173/84 133/87  Pulse: (!) 57 63  Resp: 20 20  Temp:  99 F (37.2 C)  SpO2: 99% 93%   Vitals:   07/19/18 2052 07/19/18 2128 07/20/18 0404 07/20/18 0500  BP: (!) 200/117 (!) 173/84 133/87   Pulse: (!) 55 (!) 57 63   Resp: 20 20 20    Temp: (!) 97.4 F (36.3 C)  99 F (37.2 C)   TempSrc: Oral  Oral   SpO2: 100% 99% 93%   Weight:    132.9 kg  Height:        General: Pt is alert, awake, not in acute distress Cardiovascular: RRR, S1/S2 +, no rubs, no gallops Respiratory: CTA bilaterally, no wheezing, no rhonchi Abdominal: Soft, NT, ND, bowel sounds + Extremities: no edema, no cyanosis    The results of significant diagnostics from this hospitalization (including imaging, microbiology, ancillary and laboratory) are listed below for reference.     Microbiology: No results found for this or any previous visit (from the past 240 hour(s)).   Labs: BNP (last 3 results) Recent Labs    07/18/18 1016  BNP 325.3*  Basic Metabolic Panel: Recent Labs  Lab 07/18/18 1015 07/19/18 0514 07/20/18 0527  NA 146* 144 141  K 3.1* 2.8* 3.1*  CL 106 100 101  CO2 28 31 29   GLUCOSE 173* 142* 117*  BUN 34* 27* 28*  CREATININE 1.95* 1.76* 1.83*  CALCIUM 8.7* 8.4* 8.4*  MG 1.8  --  1.6*   Liver Function Tests: Recent Labs  Lab 07/18/18 1015  AST 20  ALT 17  ALKPHOS 73  BILITOT 0.8  PROT 6.9  ALBUMIN 3.0*   No results for input(s): LIPASE, AMYLASE in the last 168 hours. No results for input(s): AMMONIA in the last 168 hours. CBC: Recent Labs  Lab 07/18/18 1015  WBC 7.8  NEUTROABS 5.0  HGB 14.7  HCT 44.8  MCV 91.1  PLT 230   Cardiac Enzymes: Recent Labs  Lab 07/18/18 1015 07/18/18 1720 07/18/18 2319  TROPONINI 0.06* 0.07* 0.07*   BNP: Invalid input(s): POCBNP CBG: No results  for input(s): GLUCAP in the last 168 hours. D-Dimer No results for input(s): DDIMER in the last 72 hours. Hgb A1c No results for input(s): HGBA1C in the last 72 hours. Lipid Profile Recent Labs    07/19/18 0514  CHOL 209*  HDL 32*  LDLCALC 137*  TRIG 198*  CHOLHDL 6.5   Thyroid function studies No results for input(s): TSH, T4TOTAL, T3FREE, THYROIDAB in the last 72 hours.  Invalid input(s): FREET3 Anemia work up No results for input(s): VITAMINB12, FOLATE, FERRITIN, TIBC, IRON, RETICCTPCT in the last 72 hours. Urinalysis    Component Value Date/Time   COLORURINE YELLOW 07/18/2018 1016   APPEARANCEUR CLEAR 07/18/2018 1016   LABSPEC 1.012 07/18/2018 1016   PHURINE 5.0 07/18/2018 1016   GLUCOSEU NEGATIVE 07/18/2018 1016   HGBUR SMALL (A) 07/18/2018 1016   BILIRUBINUR NEGATIVE 07/18/2018 1016   KETONESUR NEGATIVE 07/18/2018 1016   PROTEINUR 100 (A) 07/18/2018 1016   UROBILINOGEN 0.2 11/19/2008 0224   NITRITE NEGATIVE 07/18/2018 1016   LEUKOCYTESUR NEGATIVE 07/18/2018 1016   Sepsis Labs Invalid input(s): PROCALCITONIN,  WBC,  LACTICIDVEN Microbiology No results found for this or any previous visit (from the past 240 hour(s)).   Time coordinating discharge: 34 minutes  SIGNED:   Alwyn Ren, MD  [facility] 07/20/2018, 8:27 AM Pager   If 7PM-7AM, please contact night-coverage [email or web address] Password [email or web address]

## 2018-07-20 NOTE — Plan of Care (Signed)
Discharge instructions reviewed with Pt. Questions answered,verbalized understanding. Hard copy script for Clonidine given to Pt. Pt. Transported to main entrance via wheelchair to be taken home by friend.

## 2020-06-15 DEATH — deceased
